# Patient Record
Sex: Female | Born: 1935 | Race: Black or African American | Hispanic: No | Marital: Single | Smoking: Never smoker
Health system: Southern US, Community
[De-identification: ages and names within clinical notes are randomized; demographics above are authoritative.]

## PROBLEM LIST (undated history)

## (undated) DIAGNOSIS — C55 Malignant neoplasm of uterus, part unspecified: Secondary | ICD-10-CM

## (undated) DIAGNOSIS — I10 Essential (primary) hypertension: Secondary | ICD-10-CM

## (undated) DIAGNOSIS — E119 Type 2 diabetes mellitus without complications: Secondary | ICD-10-CM

## (undated) DIAGNOSIS — E785 Hyperlipidemia, unspecified: Secondary | ICD-10-CM

## (undated) DIAGNOSIS — C569 Malignant neoplasm of unspecified ovary: Secondary | ICD-10-CM

## (undated) HISTORY — PX: TOTAL ABDOMINAL HYSTERECTOMY: SHX209

---

## 2007-04-17 ENCOUNTER — Ambulatory Visit: Payer: Self-pay | Admitting: Emergency Medicine

## 2007-05-18 ENCOUNTER — Ambulatory Visit: Payer: Self-pay | Admitting: Internal Medicine

## 2007-07-10 ENCOUNTER — Ambulatory Visit: Payer: Self-pay | Admitting: Obstetrics and Gynecology

## 2008-04-29 ENCOUNTER — Ambulatory Visit: Payer: Self-pay

## 2008-05-03 ENCOUNTER — Ambulatory Visit: Payer: Self-pay | Admitting: Orthopedic Surgery

## 2008-06-08 ENCOUNTER — Encounter: Payer: Self-pay | Admitting: Orthopedic Surgery

## 2008-06-30 ENCOUNTER — Encounter: Payer: Self-pay | Admitting: Orthopedic Surgery

## 2008-09-27 ENCOUNTER — Ambulatory Visit: Payer: Self-pay | Admitting: Unknown Physician Specialty

## 2009-02-07 ENCOUNTER — Ambulatory Visit: Payer: Self-pay | Admitting: Internal Medicine

## 2009-02-20 ENCOUNTER — Ambulatory Visit: Payer: Self-pay | Admitting: Unknown Physician Specialty

## 2010-02-22 ENCOUNTER — Ambulatory Visit: Payer: Self-pay | Admitting: Internal Medicine

## 2011-02-26 ENCOUNTER — Ambulatory Visit: Payer: Self-pay | Admitting: Internal Medicine

## 2012-04-14 ENCOUNTER — Ambulatory Visit: Payer: Self-pay | Admitting: Internal Medicine

## 2012-07-07 ENCOUNTER — Ambulatory Visit: Payer: Self-pay | Admitting: Cardiology

## 2013-05-18 ENCOUNTER — Ambulatory Visit: Payer: Self-pay | Admitting: Internal Medicine

## 2014-05-20 ENCOUNTER — Ambulatory Visit: Payer: Self-pay | Admitting: Internal Medicine

## 2015-03-16 ENCOUNTER — Other Ambulatory Visit: Payer: Self-pay | Admitting: Internal Medicine

## 2015-03-16 DIAGNOSIS — R1031 Right lower quadrant pain: Secondary | ICD-10-CM

## 2015-03-22 ENCOUNTER — Ambulatory Visit: Admission: RE | Admit: 2015-03-22 | Payer: Medicare Other | Source: Ambulatory Visit

## 2015-04-05 ENCOUNTER — Ambulatory Visit: Admission: RE | Admit: 2015-04-05 | Payer: Medicare Other | Source: Ambulatory Visit

## 2015-05-03 ENCOUNTER — Other Ambulatory Visit: Payer: Self-pay | Admitting: Cardiology

## 2015-05-03 DIAGNOSIS — R1031 Right lower quadrant pain: Secondary | ICD-10-CM

## 2015-05-10 ENCOUNTER — Ambulatory Visit
Admission: RE | Admit: 2015-05-10 | Discharge: 2015-05-10 | Disposition: A | Discharge status: Home / Self Care | Payer: Medicare Other | Source: Ambulatory Visit | Attending: Internal Medicine | Admitting: Internal Medicine

## 2015-05-10 DIAGNOSIS — R911 Solitary pulmonary nodule: Secondary | ICD-10-CM | POA: Diagnosis not present

## 2015-05-10 DIAGNOSIS — N281 Cyst of kidney, acquired: Secondary | ICD-10-CM | POA: Insufficient documentation

## 2015-05-10 DIAGNOSIS — N200 Calculus of kidney: Secondary | ICD-10-CM | POA: Diagnosis not present

## 2015-05-10 DIAGNOSIS — R1031 Right lower quadrant pain: Secondary | ICD-10-CM

## 2015-05-10 HISTORY — DX: Essential (primary) hypertension: I10

## 2015-05-10 MED ORDER — IOHEXOL 300 MG/ML  SOLN
100.0000 mL | Freq: Once | INTRAMUSCULAR | Status: AC | PRN
  Administered 2015-05-10: 100 mL via INTRAVENOUS

## 2015-05-17 ENCOUNTER — Other Ambulatory Visit: Payer: Self-pay | Admitting: Internal Medicine

## 2015-05-17 DIAGNOSIS — R932 Abnormal findings on diagnostic imaging of liver and biliary tract: Secondary | ICD-10-CM

## 2015-05-18 ENCOUNTER — Encounter: Payer: Self-pay | Admitting: Emergency Medicine

## 2015-05-18 ENCOUNTER — Observation Stay
Admission: EM | Admit: 2015-05-18 | Discharge: 2015-05-22 | Disposition: A | Discharge status: Home / Self Care | Payer: Medicare Other | Attending: Internal Medicine | Admitting: Internal Medicine

## 2015-05-18 ENCOUNTER — Emergency Department: Payer: Medicare Other

## 2015-05-18 DIAGNOSIS — Z1611 Resistance to penicillins: Secondary | ICD-10-CM | POA: Insufficient documentation

## 2015-05-18 DIAGNOSIS — R0602 Shortness of breath: Secondary | ICD-10-CM | POA: Insufficient documentation

## 2015-05-18 DIAGNOSIS — N390 Urinary tract infection, site not specified: Secondary | ICD-10-CM | POA: Diagnosis present

## 2015-05-18 DIAGNOSIS — R627 Adult failure to thrive: Secondary | ICD-10-CM | POA: Insufficient documentation

## 2015-05-18 DIAGNOSIS — Z596 Low income: Secondary | ICD-10-CM | POA: Diagnosis not present

## 2015-05-18 DIAGNOSIS — R05 Cough: Secondary | ICD-10-CM | POA: Diagnosis not present

## 2015-05-18 DIAGNOSIS — Z833 Family history of diabetes mellitus: Secondary | ICD-10-CM | POA: Diagnosis not present

## 2015-05-18 DIAGNOSIS — R509 Fever, unspecified: Secondary | ICD-10-CM | POA: Diagnosis not present

## 2015-05-18 DIAGNOSIS — R4182 Altered mental status, unspecified: Secondary | ICD-10-CM | POA: Diagnosis present

## 2015-05-18 DIAGNOSIS — Z8673 Personal history of transient ischemic attack (TIA), and cerebral infarction without residual deficits: Secondary | ICD-10-CM | POA: Insufficient documentation

## 2015-05-18 DIAGNOSIS — E785 Hyperlipidemia, unspecified: Secondary | ICD-10-CM | POA: Insufficient documentation

## 2015-05-18 DIAGNOSIS — I10 Essential (primary) hypertension: Secondary | ICD-10-CM | POA: Diagnosis present

## 2015-05-18 DIAGNOSIS — I2510 Atherosclerotic heart disease of native coronary artery without angina pectoris: Secondary | ICD-10-CM | POA: Diagnosis not present

## 2015-05-18 DIAGNOSIS — Z7982 Long term (current) use of aspirin: Secondary | ICD-10-CM | POA: Diagnosis not present

## 2015-05-18 DIAGNOSIS — Z90710 Acquired absence of both cervix and uterus: Secondary | ICD-10-CM | POA: Insufficient documentation

## 2015-05-18 DIAGNOSIS — R079 Chest pain, unspecified: Principal | ICD-10-CM | POA: Insufficient documentation

## 2015-05-18 DIAGNOSIS — B961 Klebsiella pneumoniae [K. pneumoniae] as the cause of diseases classified elsewhere: Secondary | ICD-10-CM | POA: Insufficient documentation

## 2015-05-18 DIAGNOSIS — R112 Nausea with vomiting, unspecified: Secondary | ICD-10-CM | POA: Diagnosis not present

## 2015-05-18 LAB — COMPREHENSIVE METABOLIC PANEL
ALK PHOS: 83 U/L (ref 38–126)
ALT: 14 U/L (ref 14–54)
AST: 20 U/L (ref 15–41)
Albumin: 4.2 g/dL (ref 3.5–5.0)
Anion gap: 8 (ref 5–15)
BUN: 16 mg/dL (ref 6–20)
CALCIUM: 8.8 mg/dL — AB (ref 8.9–10.3)
CHLORIDE: 109 mmol/L (ref 101–111)
CO2: 25 mmol/L (ref 22–32)
CREATININE: 1.1 mg/dL — AB (ref 0.44–1.00)
GFR calc non Af Amer: 46 mL/min — ABNORMAL LOW (ref >60)
GFR, EST AFRICAN AMERICAN: 53 mL/min — AB (ref >60)
Glucose, Bld: 107 mg/dL — ABNORMAL HIGH (ref 65–99)
Potassium: 3.7 mmol/L (ref 3.5–5.1)
SODIUM: 142 mmol/L (ref 135–145)
Total Bilirubin: 0.2 mg/dL — ABNORMAL LOW (ref 0.3–1.2)
Total Protein: 7.5 g/dL (ref 6.5–8.1)

## 2015-05-18 LAB — CBC
HEMATOCRIT: 39.1 % (ref 35.0–47.0)
HEMOGLOBIN: 12.6 g/dL (ref 12.0–16.0)
MCH: 29.2 pg (ref 26.0–34.0)
MCHC: 32.1 g/dL (ref 32.0–36.0)
MCV: 91 fL (ref 80.0–100.0)
Platelets: 155 10*3/uL (ref 150–440)
RBC: 4.3 MIL/uL (ref 3.80–5.20)
RDW: 14.4 % (ref 11.5–14.5)
WBC: 4.7 10*3/uL (ref 3.6–11.0)

## 2015-05-18 LAB — URINALYSIS COMPLETE WITH MICROSCOPIC (ARMC ONLY)
Bilirubin Urine: NEGATIVE
GLUCOSE, UA: NEGATIVE mg/dL
KETONES UR: NEGATIVE mg/dL
NITRITE: POSITIVE — AB
PROTEIN: NEGATIVE mg/dL
SPECIFIC GRAVITY, URINE: 1.011 (ref 1.005–1.030)
pH: 6 (ref 5.0–8.0)

## 2015-05-18 LAB — CBC WITH DIFFERENTIAL/PLATELET
BASOS PCT: 1 %
Basophils Absolute: 0 10*3/uL (ref 0–0.1)
EOS ABS: 0.1 10*3/uL (ref 0–0.7)
EOS PCT: 3 %
HCT: 38.9 % (ref 35.0–47.0)
HEMOGLOBIN: 12.6 g/dL (ref 12.0–16.0)
Lymphocytes Relative: 23 %
Lymphs Abs: 1.1 10*3/uL (ref 1.0–3.6)
MCH: 29.5 pg (ref 26.0–34.0)
MCHC: 32.4 g/dL (ref 32.0–36.0)
MCV: 90.9 fL (ref 80.0–100.0)
MONO ABS: 0.8 10*3/uL (ref 0.2–0.9)
MONOS PCT: 17 %
NEUTROS PCT: 56 %
Neutro Abs: 2.7 10*3/uL (ref 1.4–6.5)
PLATELETS: 157 10*3/uL (ref 150–440)
RBC: 4.28 MIL/uL (ref 3.80–5.20)
RDW: 14.5 % (ref 11.5–14.5)
WBC: 4.8 10*3/uL (ref 3.6–11.0)

## 2015-05-18 LAB — CK: Total CK: 118 U/L (ref 38–234)

## 2015-05-18 LAB — LIPASE, BLOOD: Lipase: 22 U/L (ref 22–51)

## 2015-05-18 LAB — TROPONIN I: Troponin I: <0.03 ng/mL (ref <0.031)

## 2015-05-18 MED ORDER — DEXTROSE 5 % IV SOLN
1.0000 g | Freq: Once | INTRAVENOUS | Status: AC
  Administered 2015-05-19: 1 g via INTRAVENOUS

## 2015-05-18 MED ORDER — SODIUM CHLORIDE 0.9 % IV BOLUS (SEPSIS)
1000.0000 mL | Freq: Once | INTRAVENOUS | Status: AC
  Administered 2015-05-18: 1000 mL via INTRAVENOUS
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
## 2015-05-19 ENCOUNTER — Encounter: Payer: Self-pay | Admitting: *Deleted

## 2015-05-19 DIAGNOSIS — R079 Chest pain, unspecified: Secondary | ICD-10-CM | POA: Diagnosis not present

## 2015-05-19 DIAGNOSIS — Z596 Low income: Secondary | ICD-10-CM

## 2015-05-19 DIAGNOSIS — I10 Essential (primary) hypertension: Secondary | ICD-10-CM | POA: Diagnosis present

## 2015-05-19 LAB — FOLATE: FOLATE: 11.1 ng/mL (ref >5.9)

## 2015-05-19 LAB — TSH: TSH: 2.044 u[IU]/mL (ref 0.350–4.500)

## 2015-05-19 LAB — VITAMIN B12: Vitamin B-12: 183 pg/mL (ref 180–914)

## 2015-05-19 MED ORDER — ATORVASTATIN CALCIUM 20 MG PO TABS
40.0000 mg | ORAL_TABLET | Freq: Every day | ORAL | Status: DC
Start: 2015-05-19 — End: 2015-05-22
  Administered 2015-05-19 – 2015-05-21 (×4): 40 mg via ORAL
  Filled 2015-05-19 (×4): qty 2

## 2015-05-19 MED ORDER — RAMIPRIL 5 MG PO CAPS
5.0000 mg | ORAL_CAPSULE | Freq: Every day | ORAL | Status: DC
  Administered 2015-05-19 – 2015-05-20 (×2): 5 mg via ORAL
  Filled 2015-05-19 (×3): qty 1

## 2015-05-19 MED ORDER — PANTOPRAZOLE SODIUM 40 MG PO TBEC
40.0000 mg | DELAYED_RELEASE_TABLET | Freq: Every day | ORAL | Status: DC
  Administered 2015-05-19 – 2015-05-22 (×4): 40 mg via ORAL
  Filled 2015-05-19 (×4): qty 1

## 2015-05-19 MED ORDER — ASPIRIN-DIPYRIDAMOLE ER 25-200 MG PO CP12
1.0000 | ORAL_CAPSULE | Freq: Two times a day (BID) | ORAL | Status: DC
  Administered 2015-05-19 – 2015-05-22 (×8): 1 via ORAL
  Filled 2015-05-19 (×8): qty 1

## 2015-05-19 MED ORDER — SODIUM CHLORIDE 0.9 % IJ SOLN
3.0000 mL | Freq: Two times a day (BID) | INTRAMUSCULAR | Status: DC
  Administered 2015-05-19 – 2015-05-22 (×8): 3 mL via INTRAVENOUS

## 2015-05-19 MED ORDER — ALPRAZOLAM 0.25 MG PO TABS: 0.2500 mg | ORAL_TABLET | Freq: Two times a day (BID) | ORAL | Status: DC | PRN

## 2015-05-19 MED ORDER — SODIUM CHLORIDE 0.9 % IJ SOLN: 3.0000 mL | INTRAMUSCULAR | Status: DC | PRN

## 2015-05-19 MED ORDER — ALUM & MAG HYDROXIDE-SIMETH 200-200-20 MG/5ML PO SUSP
15.0000 mL | Freq: Four times a day (QID) | ORAL | Status: DC | PRN
  Administered 2015-05-19 (×2): 15 mL via ORAL
  Filled 2015-05-19 (×2): qty 30

## 2015-05-19 MED ORDER — ONDANSETRON HCL 4 MG/2ML IJ SOLN
4.0000 mg | Freq: Four times a day (QID) | INTRAMUSCULAR | Status: DC | PRN
  Administered 2015-05-20 – 2015-05-21 (×2): 4 mg via INTRAVENOUS
  Filled 2015-05-19 (×2): qty 2

## 2015-05-19 MED ORDER — ENOXAPARIN SODIUM 40 MG/0.4ML ~~LOC~~ SOLN
40.0000 mg | Freq: Every day | SUBCUTANEOUS | Status: DC
  Administered 2015-05-19 – 2015-05-21 (×4): 40 mg via SUBCUTANEOUS
  Filled 2015-05-19 (×4): qty 0.4

## 2015-05-19 MED ORDER — SODIUM CHLORIDE 0.9 % IV SOLN: 250.0000 mL | INTRAVENOUS | Status: DC | PRN

## 2015-05-19 MED ORDER — AMLODIPINE BESYLATE 10 MG PO TABS
10.0000 mg | ORAL_TABLET | Freq: Every day | ORAL | Status: DC
  Administered 2015-05-19 – 2015-05-20 (×2): 10 mg via ORAL
  Filled 2015-05-19 (×3): qty 1

## 2015-05-19 MED ORDER — CITALOPRAM HYDROBROMIDE 20 MG PO TABS
20.0000 mg | ORAL_TABLET | Freq: Every day | ORAL | Status: DC
  Administered 2015-05-19 – 2015-05-22 (×4): 20 mg via ORAL
  Filled 2015-05-19 (×4): qty 1

## 2015-05-19 MED ORDER — ONDANSETRON HCL 4 MG PO TABS: 4.0000 mg | ORAL_TABLET | Freq: Four times a day (QID) | ORAL | Status: DC | PRN

## 2015-05-19 MED ORDER — ACETAMINOPHEN 325 MG PO TABS
650.0000 mg | ORAL_TABLET | Freq: Four times a day (QID) | ORAL | Status: DC | PRN
  Administered 2015-05-20 – 2015-05-21 (×2): 650 mg via ORAL
  Filled 2015-05-19 (×2): qty 2

## 2015-05-19 MED ORDER — DEXTROSE 5 % IV SOLN
1.0000 g | INTRAVENOUS | Status: DC
  Administered 2015-05-19: 1 g via INTRAVENOUS
  Filled 2015-05-19 (×2): qty 10

## 2015-05-19 NOTE — Care Management (Signed)
Admitted to [facility] with the diagnosis of urinary tract infection. Lives with son, [name] [phone]). Last seen Dr. Lisette Grinder Friday of last week. No home health. No skilled facility. No home oxygen. Uses no aids for ambulation. Good appetite. Fell last Thursday. States she does fall frequently. Physical therapy evaluation pending.  Received telephone call from Otilio Connors, Education officer, museum, for [place]. Requested psychiatry evaluation. Dr. Fritzi Mandes updated. Shelbie Ammons RN MSN Care Management [phone]
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
## 2015-05-19 NOTE — Progress Notes (Signed)
Notified Dr Posey Pronto that pt requested med for indigestion. MD verbalized to order Maalox 8ml qx6hrs prn.
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
## 2015-05-19 NOTE — Progress Notes (Addendum)
[place] at [place] NAME: Nyilah Kight    MR#:  [id number]  DATE OF BIRTH:  [birth year]  SUBJECTIVE:  Brought in to ER after found by DSS to be living in poor conditino at home. Son is at home (not the best historian either) Denies any complaints  REVIEW OF SYSTEMS:   Review of Systems  Constitutional: Negative for fever, chills and weight loss.  HENT: Negative for ear discharge, ear pain and nosebleeds.   Eyes: Negative for blurred vision, pain and discharge.  Respiratory: Negative for sputum production, shortness of breath, wheezing and stridor.   Cardiovascular: Negative for chest pain, palpitations, orthopnea and PND.  Gastrointestinal: Negative for nausea, vomiting, abdominal pain and diarrhea.  Genitourinary: Negative for urgency and frequency.  Musculoskeletal: Negative for back pain and joint pain.  Neurological: Negative for sensory change, speech change, focal weakness and weakness.  Psychiatric/Behavioral: Negative for depression and hallucinations. The patient is not nervous/anxious.   All other systems reviewed and are negative.  Tolerating Diet:yes Tolerating PT: pending  DRUG ALLERGIES:  No Known Allergies  VITALS:  Blood pressure 157/52, pulse 71, temperature 99 F (37.2 C), temperature source Oral, resp. rate 18, height 5\' 7"  (1.702 m), weight 77.747 kg (171 lb 6.4 oz), SpO2 99 %.  PHYSICAL EXAMINATION:   Physical Exam  GENERAL:  81 y.o.-year-old patient lying in the bed with no acute distress.  EYES: Pupils equal, round, reactive to light and accommodation. No scleral icterus. Extraocular muscles intact.  HEENT: Head atraumatic, normocephalic. Oropharynx and nasopharynx clear.  NECK:  Supple, no jugular venous distention. No thyroid enlargement, no tenderness.  LUNGS: Normal breath sounds bilaterally, no wheezing, rales, rhonchi. No use of accessory muscles of respiration.  CARDIOVASCULAR: S1, S2  normal. No murmurs, rubs, or gallops.  ABDOMEN: Soft, nontender, nondistended. Bowel sounds present. No organomegaly or mass.  EXTREMITIES: No cyanosis, clubbing or edema b/l.    NEUROLOGIC: Cranial nerves II through XII are intact. No focal Motor or sensory deficits b/l.   PSYCHIATRIC: The patient is alert and oriented x 3.  SKIN: No obvious rash, lesion, or ulcer.    LABORATORY PANEL:   CBC  Recent Labs Lab 05/18/15 1947  WBC 4.8  4.7  HGB 12.6  12.6  HCT 38.9  39.1  PLT 157  155    Chemistries   Recent Labs Lab 05/18/15 1947  NA 142  K 3.7  CL 109  CO2 25  GLUCOSE 107*  BUN 16  CREATININE 1.10*  CALCIUM 8.8*  AST 20  ALT 14  ALKPHOS 83  BILITOT 0.2*    Cardiac Enzymes  Recent Labs Lab 05/18/15 1947  TROPONINI <0.03    RADIOLOGY:  Dg Chest 2 View  05/18/2015   CLINICAL DATA:  Chest pain  EXAM: CHEST  2 VIEW  COMPARISON:  None.  FINDINGS: Interstitial coarsening which appears bronchitic. There is no edema, consolidation, effusion, or pneumothorax. Normal heart size and mediastinal contours. No acute osseous finding.  IMPRESSION: Bronchitic markings.  No consolidation or edema.   Electronically Signed   By: Monte Fantasia M.D.   On: 05/18/2015 21:29     ASSESSMENT AND PLAN:  81 y.o. female with a known history of hypertension, hyperlipidemia, CVA, coronary artery disease brought in by EMS since patient was found by social service with poor living conditions and no lower at home. Patient was evaluated by the ED physician and was noted to have UTI. There was a  mention of possible dementia by the ED physician  1. UTI. -pt asymptomatic at present -IV rocephine. Pending UC -wbc normal, pt afebrile 2. Hypertension, stable on home medications. 3. History of coronary artery disease, stable. No symptoms, EKG negative for acute ischemic changes, troponin 1 negative.  4. Hyperlipidemia, stable on statin 5. Poor living condition, found by DSS with no poser  at home. Lives with son -mention of possible dementia -will have psych eval done. -DSS involved -CM for d/c planning  Case discussed with Care Management/Social Worker. Management plans discussed with the patient, family and they are in agreement.  CODE STATUS: full  DVT Prophylaxis: lovenox  TOTAL TIME TAKING CARE OF THIS PATIENT: 30 minutes.  >50% time spent on counselling and coordination of care with pt,son and CM   Aviela Blundell M.D on 05/19/2015 at 11:31 AM  Between 7am to 6pm - Pager - [phone]  After 6pm go to [email or web address] - password [email or web address] [facility]  Office  [phone]  CC: Primary care physician; Madelyn Brunner, MD

## 2015-05-19 NOTE — Evaluation (Signed)
Physical Therapy Evaluation Patient Details Name: Allison Macias MRN: [id number] DOB: 27-Jan-1932 Today's Date: 05/19/2015   History of Present Illness  presented to ER secondary to reported chest pain (x2 weeks), found by DSS in poor living conditions and with history of frequent falls; admitted with UTI.  Clinical Impression  Upon evaluation, patient alert and oriented to basic information; follows all commands.  Demonstrates strength and ROM grossly WFL and symmetrical; no focal weakness appreciated.  Able to complete bed mobility mod indep; sit/stand, basic transfers and gait (50') without assist device, cga/min assist.  Improves to cga/close sup with use of RW; though gait speed remains significantly below age-matched norms (10' walk time, 12 seconds), indicative of increased fall risk.  Recommend continued use of RW with all mobility at this time.  Patient voices understanding/agreement and reports having RW at home already. Would benefit from skilled PT to address above deficits and promote optimal return to PLOF; Recommend transition to [facility] upon discharge from acute hospitalization.     Follow Up Recommendations Home health PT    Equipment Recommendations       Recommendations for Other Services       Precautions / Restrictions Precautions Precautions: Fall Restrictions Weight Bearing Restrictions: No      Mobility  Bed Mobility Overal bed mobility: Modified Independent                Transfers Overall transfer level: Needs assistance Equipment used: Rolling walker (2 wheeled) Transfers: Sit to/from Stand Sit to Stand: Min guard;Min assist            Ambulation/Gait Ambulation/Gait assistance: Min guard;Min assist Ambulation Distance (Feet): 50 Feet Assistive device: None     Gait velocity interpretation: <1.8 ft/sec, indicative of risk for recurrent falls General Gait Details: decreased step height/length, limited trunk rotation and arm swing; very  guarded.  limited ability to respond to external perturbation.  Stairs            Wheelchair Mobility    Modified Rankin (Stroke Patients Only)       Balance Overall balance assessment: Needs assistance Sitting-balance support: No upper extremity supported;Feet supported Sitting balance-Leahy Scale: Good     Standing balance support: No upper extremity supported Standing balance-Leahy Scale: Fair Standing balance comment: standing functional reach approx 3-4" from immediate BOS                             Pertinent Vitals/Pain Pain Assessment: No/denies pain    Home Living Family/patient expects to be discharged to:: Private residence Living Arrangements: Children (son, able to provide 24 hour sup as needed per patient) Available Help at Discharge: Family Type of Home: House Home Access: Stairs to enter Entrance Stairs-Rails: Right Entrance Stairs-Number of Steps: 3 Home Layout: Two level;Able to live on main level with bedroom/bathroom        Prior Function Level of Independence: Independent         Comments: Indep for all household activities/ADLs without assist device; does endorse history of frequent falls (unassociated with any specific activity, risk factors)     Hand Dominance        Extremity/Trunk Assessment   Upper Extremity Assessment: Overall WFL for tasks assessed           Lower Extremity Assessment: Overall WFL for tasks assessed         Communication   Communication: No difficulties  Cognition Arousal/Alertness: Awake/alert Behavior During Therapy:  WFL for tasks assessed/performed Overall Cognitive Status: Within Functional Limits for tasks assessed                      General Comments      Exercises Other Exercises Other Exercises: Gait x150' with RW, cga/close sup--improved step height/length, improved cadence and gait speed (though still significantly below age-matched norms; 10' walk time, 12  seconds).  Recommend continued use of RW for all mobiltiy at this time; patient voiced understanding/agreement (reports having one at home already). (8 min)      Assessment/Plan    PT Assessment    PT Diagnosis Difficulty walking;Generalized weakness   PT Problem List    PT Treatment Interventions     PT Goals (Current goals can be found in the Care Plan section) Acute Rehab PT Goals Patient Stated Goal: "to go back to bed" PT Goal Formulation: With patient Time For Goal Achievement: 06/02/15 Potential to Achieve Goals: Good    Frequency     Barriers to discharge        Co-evaluation               End of Session Equipment Utilized During Treatment: Gait belt Activity Tolerance: Patient tolerated treatment well Patient left: in bed;with call bell/phone within reach;with bed alarm set      Functional Assessment Tool Used: clinical judgement, 10' walk time/gait speed Functional Limitation: Mobility: Walking and moving around Mobility: Walking and Moving Around Current Status (Z6109): At least 20 percent but less than 40 percent impaired, limited or restricted Mobility: Walking and Moving Around Goal Status [phone]): At least 1 percent but less than 20 percent impaired, limited or restricted    Time: 1410-1424 PT Time Calculation (min) (ACUTE ONLY): 14 min   Charges:   PT Evaluation $Initial PT Evaluation Tier I: 1 Procedure PT Treatments $Gait Training: 8-22 mins   PT G Codes:   PT G-Codes **NOT FOR INPATIENT CLASS** Functional Assessment Tool Used: clinical judgement, 10' walk time/gait speed Functional Limitation: Mobility: Walking and moving around Mobility: Walking and Moving Around Current Status (U9811): At least 20 percent but less than 40 percent impaired, limited or restricted Mobility: Walking and Moving Around Goal Status [phone]): At least 1 percent but less than 20 percent impaired, limited or restricted    Jarah Pember H. Owens Shark, PT, DPT,  NCS 05/19/2015, 3:49 PM [phone]

## 2015-05-19 NOTE — Progress Notes (Signed)
ANTIBIOTIC CONSULT NOTE - INITIAL  Pharmacy Consult for ceftriaxone Indication: UTI  No Known Allergies  Patient Measurements: Height: 5\' 7"  (170.2 cm) Weight: 172 lb 14.4 oz (78.427 kg) IBW/kg (Calculated) : 61.6 Adjusted Body Weight: 78.4  Vital Signs: Temp: 98 F (36.7 C) (08/19 0049) Temp Source: Oral (08/19 0049) BP: 162/58 mmHg (08/19 0049) Pulse Rate: 65 (08/19 0049) Intake/Output from previous day:   Intake/Output from this shift:    Labs:  Recent Labs  05/18/15 1947  WBC 4.8  4.7  HGB 12.6  12.6  PLT 157  155  CREATININE 1.10*   Estimated Creatinine Clearance: 43.2 mL/min (by C-G formula based on Cr of 1.1). No results for input(s): VANCOTROUGH, VANCOPEAK, VANCORANDOM, GENTTROUGH, GENTPEAK, GENTRANDOM, TOBRATROUGH, TOBRAPEAK, TOBRARND, AMIKACINPEAK, AMIKACINTROU, AMIKACIN in the last 72 hours.   Microbiology: No results found for this or any previous visit (from the past 720 hour(s)).  Medical History: Past Medical History  Diagnosis Date  . Cancer   . Hypertension     Medications:  Infusions:   Assessment: 83 yof cc UTI/found by social services with poor living conditions and no power at home.   Goal of Therapy:  Normal temperature/labs/vitals, resolution of infection  Plan:  Expected duration 5 days with resolution of temperature and/or normalization of WBC. Started ceftriaxone 1 gm IV Q24H - will follow for culture and sensitivity.   Laural Benes, Pharm.D. Clinical Pharmacy 05/19/2015,12:56 AM

## 2015-05-19 NOTE — Consult Note (Signed)
[facility] Face-to-Face Psychiatry Consult   Reason for Consult:  Consult for this 81 year old woman currently in the hospital evidently with a urinary tract infection and elevated blood pressure. Referring Physician:  Posey Pronto Patient Identification: SYONA WROBLEWSKI MRN:  [id number] Principal Diagnosis: Poverty Diagnosis:   Patient Active Problem List   Diagnosis Date Noted  . HTN (hypertension) [I10] 05/19/2015  . Poverty [Z59.6] 05/19/2015  . UTI (urinary tract infection) [N39.0] 05/18/2015  . HTN (hypertension), benign [I10] 05/18/2015  . CAD (coronary artery disease) [I25.10] 05/18/2015    Total Time spent with patient: 45 minutes  Subjective:   NATALIAH HATLESTAD is a 81 y.o. female patient admitted with "I'm just feeling so cold" patient had no psychiatric complaint.Marland Kitchen  HPI:  Patient interviewed chart reviewed. Labs reviewed. 81 year old woman brought into the hospital after a social worker found her to apparently be in some degree of distress with difficult living circumstances. The patient tells me a Education officer, museum found her at home where there are utilities had recently been turned off. She has no lites and no water and no gas at home. This is because she and her son have been short of money and had not been keeping up with her bills. Patient has no mental health complaints. Denies being depressed. Denies suicidal thoughts. Denies feeling like she's having any impairment of her memory. No evidence of psychotic symptoms. Denies any abuse of substances.  Past psychiatric history: Patient denies any past psychiatric history or treatment  Social history: She lives with her adult son. Her son appears to be impaired in some way from what I could tell. He seemed to have some cognitive impairment. She has no other living children. Says that she has some other family members but they don't come around as much as they used to. Patient complains that while she and her son are supposed to both be getting  checks that she feels like she is frequently cheated out of her money. She indicates feeling like her bank is not treating her well. Frequently run short of money.  Medical history: High blood pressure current urinary tract infection. Possible coronary artery disease.  Substance abuse history: No alcohol or drug abuse currently or in the past.  Current medications: She does not know all the medicine she takes at home. Dr. Lisette Grinder is her primary care doctor. HPI Elements:   Quality:  Social difficulties with inability to pay all of her bills. Severity:  Resulting in severe social problems. Timing:  Chronic. Duration:  Ongoing possible worsening. Context:  Lack of support.  Past Medical History:  Past Medical History  Diagnosis Date  . Cancer   . Hypertension     Past Surgical History  Procedure Laterality Date  . Total abdominal hysterectomy     Family History:  Family History  Problem Relation Age of Onset  . Diabetes Sister    Social History:  History  Alcohol Use No     History  Drug Use No    Social History   Social History  . Marital Status: Single    Spouse Name: N/A  . Number of Children: N/A  . Years of Education: N/A   Social History Main Topics  . Smoking status: Never Smoker   . Smokeless tobacco: None  . Alcohol Use: No  . Drug Use: No  . Sexual Activity: Not Asked   Other Topics Concern  . None   Social History Narrative   Additional Social History:  Allergies:  No Known Allergies  Labs:  Results for orders placed or performed during the hospital encounter of 05/18/15 (from the past 48 hour(s))  CBC     Status: None   Collection Time: 05/18/15  7:47 PM  Result Value Ref Range   WBC 4.7 3.6 - 11.0 K/uL   RBC 4.30 3.80 - 5.20 MIL/uL   Hemoglobin 12.6 12.0 - 16.0 g/dL   HCT 39.1 35.0 - 47.0 %   MCV 91.0 80.0 - 100.0 fL   MCH 29.2 26.0 - 34.0 pg   MCHC 32.1 32.0 - 36.0 g/dL   RDW 14.4 11.5 - 14.5 %    Platelets 155 150 - 440 K/uL  Troponin I     Status: None   Collection Time: 05/18/15  7:47 PM  Result Value Ref Range   Troponin I <0.03 <0.031 ng/mL    Comment:        NO INDICATION OF MYOCARDIAL INJURY.   Comprehensive metabolic panel     Status: Abnormal   Collection Time: 05/18/15  7:47 PM  Result Value Ref Range   Sodium 142 135 - 145 mmol/L   Potassium 3.7 3.5 - 5.1 mmol/L   Chloride 109 101 - 111 mmol/L   CO2 25 22 - 32 mmol/L   Glucose, Bld 107 (H) 65 - 99 mg/dL   BUN 16 6 - 20 mg/dL   Creatinine, Ser 1.10 (H) 0.44 - 1.00 mg/dL   Calcium 8.8 (L) 8.9 - 10.3 mg/dL   Total Protein 7.5 6.5 - 8.1 g/dL   Albumin 4.2 3.5 - 5.0 g/dL   AST 20 15 - 41 U/L   ALT 14 14 - 54 U/L   Alkaline Phosphatase 83 38 - 126 U/L   Total Bilirubin 0.2 (L) 0.3 - 1.2 mg/dL   GFR calc non Af Amer 46 (L) >60 mL/min   GFR calc Af Amer 53 (L) >60 mL/min    Comment: (NOTE) The eGFR has been calculated using the CKD EPI equation. This calculation has not been validated in all clinical situations. eGFR's persistently <60 mL/min signify possible Chronic Kidney Disease.    Anion gap 8 5 - 15  CBC with Differential     Status: None   Collection Time: 05/18/15  7:47 PM  Result Value Ref Range   WBC 4.8 3.6 - 11.0 K/uL   RBC 4.28 3.80 - 5.20 MIL/uL   Hemoglobin 12.6 12.0 - 16.0 g/dL   HCT 38.9 35.0 - 47.0 %   MCV 90.9 80.0 - 100.0 fL   MCH 29.5 26.0 - 34.0 pg   MCHC 32.4 32.0 - 36.0 g/dL   RDW 14.5 11.5 - 14.5 %   Platelets 157 150 - 440 K/uL   Neutrophils Relative % 56 %   Neutro Abs 2.7 1.4 - 6.5 K/uL   Lymphocytes Relative 23 %   Lymphs Abs 1.1 1.0 - 3.6 K/uL   Monocytes Relative 17 %   Monocytes Absolute 0.8 0.2 - 0.9 K/uL   Eosinophils Relative 3 %   Eosinophils Absolute 0.1 0 - 0.7 K/uL   Basophils Relative 1 %   Basophils Absolute 0.0 0 - 0.1 K/uL  CK     Status: None   Collection Time: 05/18/15  7:47 PM  Result Value Ref Range   Total CK 118 38 - 234 U/L  Lipase, blood      Status: None   Collection Time: 05/18/15  7:47 PM  Result Value Ref Range   Lipase  22 22 - 51 U/L  Urinalysis complete, with microscopic     Status: Abnormal   Collection Time: 05/18/15  9:54 PM  Result Value Ref Range   Color, Urine YELLOW (A) YELLOW   APPearance HAZY (A) CLEAR   Glucose, UA NEGATIVE NEGATIVE mg/dL   Bilirubin Urine NEGATIVE NEGATIVE   Ketones, ur NEGATIVE NEGATIVE mg/dL   Specific Gravity, Urine 1.011 1.005 - 1.030   Hgb urine dipstick 1+ (A) NEGATIVE   pH 6.0 5.0 - 8.0   Protein, ur NEGATIVE NEGATIVE mg/dL   Nitrite POSITIVE (A) NEGATIVE   Leukocytes, UA 3+ (A) NEGATIVE   RBC / HPF 0-5 0 - 5 RBC/hpf   WBC, UA TOO NUMEROUS TO COUNT 0 - 5 WBC/hpf   Bacteria, UA MANY (A) NONE SEEN   Squamous Epithelial / LPF 0-5 (A) NONE SEEN   Mucous PRESENT   Vitamin B12     Status: None   Collection Time: 05/18/15 10:29 PM  Result Value Ref Range   Vitamin B-12 183 180 - 914 pg/mL    Comment: (NOTE) This assay is not validated for testing neonatal or myeloproliferative syndrome specimens for Vitamin B12 levels. Performed at [facility]   Folate     Status: None   Collection Time: 05/18/15 10:29 PM  Result Value Ref Range   Folate 11.1 >5.9 ng/mL  TSH     Status: None   Collection Time: 05/18/15 10:29 PM  Result Value Ref Range   TSH 2.044 0.350 - 4.500 uIU/mL    Vitals: Blood pressure 152/57, pulse 66, temperature 98.2 F (36.8 C), temperature source Oral, resp. rate 18, height _0  (1.702 m), weight 77.747 kg (171 lb 6.4 oz), SpO2 95 %.  Risk to Self: Is patient at risk for suicide?: No Risk to Others:   Prior Inpatient Therapy:   Prior Outpatient Therapy:    Current Facility-Administered Medications  Medication Dose Route Frequency Provider Last Rate Last Dose  . 0.9 %  sodium chloride infusion  250 mL Intravenous PRN Juluis Mire, MD      . acetaminophen (TYLENOL) tablet 650 mg  650 mg Oral Q6H PRN Juluis Mire, MD      . ALPRAZolam Duanne Moron)  tablet 0.25 mg  0.25 mg Oral BID PRN Juluis Mire, MD      . alum & mag hydroxide-simeth (MAALOX/MYLANTA) 200-200-20 MG/5ML suspension 15 mL  15 mL Oral Q6H PRN Fritzi Mandes, MD   15 mL at 05/19/15 1055  . amLODipine (NORVASC) tablet 10 mg  10 mg Oral Daily Juluis Mire, MD   10 mg at 05/19/15 1053  . atorvastatin (LIPITOR) tablet 40 mg  40 mg Oral QHS Juluis Mire, MD   40 mg at 05/19/15 0158  . cefTRIAXone (ROCEPHIN) 1 g in dextrose 5 % 50 mL IVPB  1 g Intravenous Q24H Juluis Mire, MD      . citalopram (CELEXA) tablet 20 mg  20 mg Oral Daily Juluis Mire, MD   20 mg at 05/19/15 1053  . dipyridamole-aspirin (AGGRENOX) 200-25 MG per 12 hr capsule 1 capsule  1 capsule Oral BID Juluis Mire, MD   1 capsule at 05/19/15 1053  . enoxaparin (LOVENOX) injection 40 mg  40 mg Subcutaneous Q2000 Juluis Mire, MD   40 mg at 05/19/15 0158  . ondansetron (ZOFRAN) tablet 4 mg  4 mg Oral Q6H PRN Juluis Mire, MD       Or  .  ondansetron (ZOFRAN) injection 4 mg  4 mg Intravenous Q6H PRN Juluis Mire, MD      . pantoprazole (PROTONIX) EC tablet 40 mg  40 mg Oral QAC breakfast Juluis Mire, MD   40 mg at 05/19/15 4270  . ramipril (ALTACE) capsule 5 mg  5 mg Oral Daily Juluis Mire, MD   5 mg at 05/19/15 1059  . sodium chloride 0.9 % injection 3 mL  3 mL Intravenous Q12H Juluis Mire, MD   3 mL at 05/19/15 1055  . sodium chloride 0.9 % injection 3 mL  3 mL Intravenous PRN Juluis Mire, MD        Musculoskeletal: Strength & Muscle Tone: within normal limits Gait & Station: unable to stand Patient leans: N/A  Psychiatric Specialty Exam: Physical Exam  Constitutional: She appears well-developed and well-nourished. She has a sickly appearance.  HENT:  Head: Normocephalic and atraumatic.  Eyes: Conjunctivae are normal. Pupils are equal, round, and reactive to light.  Neck: Normal range of motion.  Cardiovascular: Normal heart sounds.   Respiratory: Effort  normal.  GI: Soft.  Musculoskeletal: Normal range of motion.  Neurological: She is alert.  Skin: Skin is warm and dry.  Psychiatric: She has a normal mood and affect. Her speech is normal and behavior is normal. Judgment and thought content normal. Cognition and memory are normal.    Review of Systems  Constitutional: Positive for chills, weight loss and malaise/fatigue.  HENT: Negative.   Eyes: Negative.   Respiratory: Positive for cough.   Cardiovascular: Negative.   Gastrointestinal: Positive for nausea.  Musculoskeletal: Negative.   Skin: Negative.   Neurological: Negative.   Psychiatric/Behavioral: Negative for depression, suicidal ideas, hallucinations, memory loss and substance abuse. The patient is not nervous/anxious and does not have insomnia.     Blood pressure 152/57, pulse 66, temperature 98.2 F (36.8 C), temperature source Oral, resp. rate 18, height _0  (1.702 m), weight 77.747 kg (171 lb 6.4 oz), SpO2 95 %.Body mass index is 26.84 kg/(m^2).  General Appearance: Fairly Groomed  Engineer, water::  Good  Speech:  Clear and Coherent  Volume:  Normal  Mood:  Anxious  Affect:  Congruent  Thought Process:  Goal Directed  Orientation:  Full (Time, Place, and Person)  Thought Content:  Negative  Suicidal Thoughts:  No  Homicidal Thoughts:  No  Memory:  Immediate;   Good Recent;   Good Remote;   Good  Judgement:  Intact  Insight:  Present  Psychomotor Activity:  Decreased  Concentration:  Good  Recall:  Good  Fund of Knowledge:Good  Language: Good  Akathisia:  No  Handed:  Right  AIMS (if indicated):     Assets:  Communication Skills Desire for Improvement Resilience  ADL's:  Intact  Cognition: WNL  Sleep:      Medical Decision Making: Review of Psycho-Social Stressors (1) and Established Problem, Worsening (2)  Treatment Plan Summary: Plan 81 year old woman in the hospital in part because of severe impoverishment at home. The consult mention the  possibility of dementia. To my exam today the patient is clear and coherent with intact short-term and long-term memory and appears to be clear in her thinking. I don't see any indication of significant dementia. She denies any depressive symptoms. There is no psychosis or substance abuse. I do not find an actual psychiatric diagnosis or problem with this patient. She appears to be struggling to take care of herself and her clearly impaired son as  she is increasingly elderly and frail with minimal social support. Problems appear to be social rather than psychiatric. No indication for any psychiatric intervention or treatment. I will try and checked by to see if I missed anything or can be of any further help. No medication at this time.  Plan:  Supportive therapy provided about ongoing stressors. Disposition: No psychiatric intervention I will check up over the next day or 2.  John Clapacs 05/19/2015 7:35 PM

## 2015-05-19 NOTE — Plan of Care (Addendum)
Problem: Discharge Progression Outcomes Goal: Other Discharge Outcomes/Goals VSS. Denies pain. Vomited once in am, verbalized relief, denied nausea med. C/o indigestion/hurtburn, maalox ordered and given with improvement. Tolerates diet. Pt ambulates around the nursing station with a walker and [facility] psychiatry consult.

## 2015-05-20 DIAGNOSIS — R079 Chest pain, unspecified: Secondary | ICD-10-CM | POA: Diagnosis not present

## 2015-05-20 MED ORDER — CEPHALEXIN 500 MG PO CAPS
500.0000 mg | ORAL_CAPSULE | Freq: Two times a day (BID) | ORAL | Status: DC
  Administered 2015-05-20 – 2015-05-22 (×5): 500 mg via ORAL
  Filled 2015-05-20 (×5): qty 1

## 2015-05-20 NOTE — Clinical Social Work Note (Signed)
Unable to reach customer service at [facility] to confirm if power was restored to patient's home. The phone at [facility] stated that someone could be reached between Monday and Friday. CSW has contacted [facility] office to have the DSS on call worker paged to let them know that the physician is ready to discharge patient but does not want to discharge unless she is aware if patient's power has been restored. Shela Leff MSW,LCSW [phone]

## 2015-05-20 NOTE — Progress Notes (Signed)
[place] [facility] [place] NAME: Allison Macias    MR#:  [id number]  DATE OF BIRTH:  01/28/1932  SUBJECTIVE:  Brought in to ER after found by DSS to be living in poor conditino at home. Son is at home (not the best historian either) Denies any complaints at present  REVIEW OF SYSTEMS:   Review of Systems  Constitutional: Negative for fever, chills and weight loss.  HENT: Negative for ear discharge, ear pain and nosebleeds.   Eyes: Negative for blurred vision, pain and discharge.  Respiratory: Negative for sputum production, shortness of breath, wheezing and stridor.   Cardiovascular: Negative for chest pain, palpitations, orthopnea and PND.  Gastrointestinal: Negative for nausea, vomiting, abdominal pain and diarrhea.  Genitourinary: Negative for urgency and frequency.  Musculoskeletal: Negative for back pain and joint pain.  Neurological: Negative for sensory change, speech change, focal weakness and weakness.  Psychiatric/Behavioral: Negative for depression and hallucinations. The patient is not nervous/anxious.   All other systems reviewed and are negative.  Tolerating Diet:yes Tolerating PT: HHPT  DRUG ALLERGIES:  No Known Allergies  VITALS:  Blood pressure 130/62, pulse 92, temperature 99 F (37.2 C), temperature source Oral, resp. rate 20, height 5\' 7"  (1.702 m), weight 75.887 kg (167 lb 4.8 oz), SpO2 97 %.  PHYSICAL EXAMINATION:   Physical Exam  GENERAL:  81 y.o.-year-old patient lying in the bed with no acute distress.  EYES: Pupils equal, round, reactive to light and accommodation. No scleral icterus. Extraocular muscles intact.  HEENT: Head atraumatic, normocephalic. Oropharynx and nasopharynx clear.  NECK:  Supple, no jugular venous distention. No thyroid enlargement, no tenderness.  LUNGS: Normal breath sounds bilaterally, no wheezing, rales, rhonchi. No use of accessory muscles of respiration.  CARDIOVASCULAR: S1,  S2 normal. No murmurs, rubs, or gallops.  ABDOMEN: Soft, nontender, nondistended. Bowel sounds present. No organomegaly or mass.  EXTREMITIES: No cyanosis, clubbing or edema b/l.    NEUROLOGIC: Cranial nerves II through XII are intact. No focal Motor or sensory deficits b/l.   PSYCHIATRIC: The patient is alert and oriented x 3.  SKIN: No obvious rash, lesion, or ulcer.    LABORATORY PANEL:   CBC  Recent Labs Lab 05/18/15 1947  WBC 4.8  4.7  HGB 12.6  12.6  HCT 38.9  39.1  PLT 157  155    Chemistries   Recent Labs Lab 05/18/15 1947  NA 142  K 3.7  CL 109  CO2 25  GLUCOSE 107*  BUN 16  CREATININE 1.10*  CALCIUM 8.8*  AST 20  ALT 14  ALKPHOS 83  BILITOT 0.2*    Cardiac Enzymes  Recent Labs Lab 05/18/15 1947  TROPONINI <0.03    RADIOLOGY:  Dg Chest 2 View  05/18/2015   CLINICAL DATA:  Chest pain  EXAM: CHEST  2 VIEW  COMPARISON:  None.  FINDINGS: Interstitial coarsening which appears bronchitic. There is no edema, consolidation, effusion, or pneumothorax. Normal heart size and mediastinal contours. No acute osseous finding.  IMPRESSION: Bronchitic markings.  No consolidation or edema.   Electronically Signed   By: Monte Fantasia M.D.   On: 05/18/2015 21:29     ASSESSMENT AND PLAN:  81 y.o. female with a known history of hypertension, hyperlipidemia, CVA, coronary artery disease brought in by EMS since patient was found by social service with poor living conditions and no lower at home. Patient was evaluated by the ED physician and was noted to have UTI. There  was a mention of possible dementia by the ED physician  1. UTI. -pt asymptomatic at present -IV rocephine. Change to po abxs -wbc normal, pt afebrile 2. Hypertension, stable on home medications. 3. History of coronary artery disease, stable. No symptoms, EKG negative for acute ischemic changes, troponin 1 negative.  4. Hyperlipidemia, stable on statin 5. Poor living condition, found by DSS with  no poser at home. Lives with son -mention of possible dementia -Dr clapacs input noted-no recommendations per them -DSS involved -CM for d/c planning -son not sure whether electricity is restored or not the patient otherwise ready for d/c. CSW working on it  Case discussed with Care Management/Social Worker. Management plans discussed with the patient, family and they are in agreement.  CODE STATUS: full  DVT Prophylaxis: lovenox  TOTAL TIME TAKING CARE OF THIS PATIENT: 30 minutes.  >50% time spent on counselling and coordination of care with pt,son and CM   Jony Ladnier M.D on 05/20/2015 at 10:21 AM  Between 7am to 6pm - Pager - [phone]  After 6pm go to [email or web address] - password [email or web address] [facility]  Office  [phone]  CC: Primary care physician; Madelyn Brunner, MD

## 2015-05-20 NOTE — Plan of Care (Signed)
Problem: Discharge Progression Outcomes Goal: Other Discharge Outcomes/Goals Outcome: Progressing Patient is alert and oriented, c/o chest discomfort, describing it as "bubbly". Maalox given with improvement. Son remains at bedside. Resting quietly.

## 2015-05-20 NOTE — Plan of Care (Signed)
Problem: Discharge Progression Outcomes Goal: Other Discharge Outcomes/Goals Outcome: Progressing 1.  Case management and social work consults in place- evaluating discharge needs. Pt lives at home in very poor home situation with son with limited ability to care for self/ son limited in ability to care for pt. 2. Denies pain this shift. 3. VSS. Labs stable.  4. Zofran x 1 at change of shift for nausea with relief. 5. Tolerated diet well. Tray obtained for son who was eating pt's food 6. Started on po cephalexin. No urinary co's. Rested quietly in bed.

## 2015-05-20 NOTE — Progress Notes (Signed)
Son came to nurse's desk asking for paper plate in that his mother, the pt, and he were going to share her breakfast. Sandwich tray provided to son with juice and advised pt needs to eat her breakfast tray.

## 2015-05-20 NOTE — Clinical Social Work Note (Signed)
CSW spoke with on call DSS worker: Abran Duke this afternoon and he stated he spoke with DSS supervisor, Pershing Cox and DSS caseworker, Rory Percy about my message. Sparky stated that there was no way until Monday for them to confirm whether or not patient had her electricity back on and that they would like it if patient would be able to stay through the weekend until they can get out to the home to ensure it has necessities. Shela Leff MSW,LCSW [phone]

## 2015-05-20 NOTE — Clinical Social Work Note (Signed)
Clinical Social Work Assessment  Patient Details  Name: Allison Macias MRN: [id number] Date of Birth: 04-Jan-1932  Date of referral:  05/19/15               Reason for consult:  Abuse/Neglect, Financial Concerns                Permission sought to share information with:  Family Supports Permission granted to share information::  Yes, Verbal Permission Granted  Name::        Agency::     Relationship::  son  Contact Information:     Housing/Transportation Living arrangements for the past 2 months:  [place] of Information:  Patient, Case Manager Patient Interpreter Needed:  None Criminal Activity/Legal Involvement Pertinent to Current Situation/Hospitalization:  No - Comment as needed Significant Relationships:  Adult Children Lives with:  Adult Children Do you feel safe going back to the place where you live?    Need for family participation in patient care:  Yes (Comment)  Care giving concerns:  Pt lives with her adult son who she reports is around 50 years old. She states that DSS came to their house and she told them that she wasn't feeling well so they called EMS to bring her to ED. Pt reports that the water and power have been off for about 4 days due to not paying the bill. She states that they are supposed to be cut back on today or tomorrow (Saturday). DSS likely intervening to assist with water as that can be turned on same day emergently by the city.  Pt reports that adult son does not work and does not have transportation but "gets a ride" when he can. Pt has adult son's cell phone in her room which is functioning but Pt has limited knowledge on how to use it. Pt reports that "[facility]" sends a car to get her to her PCP office. Pt does have [facility] but CSW cannot verify if they arrange rides to appts for her. CSW is unsure who called APS, but they are actively involved in the case and supporting the family. RNs to do not report  any concerns with interactions between Pt and her son.   Social Worker assessment / plan:  CSW met with Pt at bedside. Pt reported that her son had to leave to meet the people who were turning on the utilities so he was not at bedside. Staff reports that he spent the night and stayed with Pt most of her hospitalization thus far. Pt was born and raised in [place], has had 4 children (1 in the home, 3 out of state), and is divorced. Pt is delirious at times, so some of her reporting is questionable. She states that she divorced because her husband "ripped a baby out of her belly" and that she ended up adopting 31 children from the community that were placed with her after being left by mothers at churches or on door steps, etc. CSW discussed needs at home, which patient is aware of the needs but is confident that they are being fixed today. CSW discussed placement if needed and Pt stated that she wants to return home. Pt did say that she is agreeable to home health coming into the home for PT and RN needs. CSW left message with Rory Percy, APS worker who initiated investigation but have not heard an update. Pt will likely return home with [facility] and APS following to assist  with stabilization in the home.   Employment status:  Retired Nurse, adult PT Recommendations:    Information / Referral to community resources:  APS (Comment Required: [place], Name & Number of worker spoken with) ([place])  Patient/Family's Response to care:  Pt's son has been supportive in the hospital according to staff. Pt's son not at bedside during [place] assessment due to coordinating reinstatement of utilities at home according to Pt. Pt appreciative of care at hospital and reports to CSW that she wants to go home. Pt appears anxious to be in hospital without her son nearby.   Patient/Family's Understanding of and Emotional Response to Diagnosis, Current Treatment, and Prognosis:  Pt  does not verbalize any concerns to CSW regarding her health. She states that she has abdominal pain to CSW during the beginning of the assessment but does not mention it once we start talking or indicates any physical discomfort.   Emotional Assessment Appearance:  Appears stated age Attitude/Demeanor/Rapport:  Apprehensive Affect (typically observed):  Anxious Orientation:  Oriented to Place, Oriented to Situation, Oriented to Self Alcohol / Substance use:  Never Used Psych involvement (Current and /or in the community):  Yes (Comment) (impatient consult pending)  Discharge Needs  Concerns to be addressed:  Home Safety Concerns, Discharge Planning Concerns Readmission within the last 30 days:  No Current discharge risk:  Lack of support system Barriers to Discharge:  Continued Medical Work up   [employer], LCSW 05/20/2015, 7:32 AM

## 2015-05-21 DIAGNOSIS — R079 Chest pain, unspecified: Secondary | ICD-10-CM | POA: Diagnosis not present

## 2015-05-21 NOTE — Progress Notes (Signed)
Pt talking on phone. Reported to nurse that she was talking to her boyfriend. "He lives in [place]." "No, I have never met him, but he loves me and we are going to get married." Other family member at bedside states he agrees with this.

## 2015-05-21 NOTE — Care Management Note (Signed)
Case Management Note  Patient Details  Name: [name] MRN: [id number] Date of Birth: 02-13-1932  Subjective/Objective:    81yo Mrs Allison Macias was admitted 05/18/15 to an Observation bed per c/o chest pain. Also has a UTI. [name] resides with her son [name] who was at her bedside during this interview. PCP=Dr Lisette Grinder. Pharmacy= [facility] in [place]. Home equipment includes a cane and a front wheel rolling walker. Per son [name], [name] uses Medicaid transportation. [facility] nurse visits once a month. No other home health services. Case management will follow for discharge planning. Anticipate need for assistance with transportation home after discharge.               Action/Plan:   Expected Discharge Date:                  Expected Discharge Plan:     In-House Referral:     Discharge planning Services     Post Acute Care Choice:    Choice offered to:     DME Arranged:    DME Agency:     HH Arranged:    [place] Agency:     Status of Service:     Medicare Important Message Given:    Date Medicare IM Given:    Medicare IM give by:    Date Additional Medicare IM Given:    Additional Medicare Important Message give by:     If discussed at [place] of Stay Meetings, dates discussed:    Additional Comments:  Nyheem Binette A, RN 05/21/2015, 3:40 PM

## 2015-05-21 NOTE — Plan of Care (Signed)
Problem: Discharge Progression Outcomes Goal: Other Discharge Outcomes/Goals Outcome: Progressing Patient is alert and oriented, c/o stomach pain. Tylenol given x1 with relief. Son remains at bedside, appears to be staying in the hospital room with patient and only getting food from hospital staff.  Patient states she is still talking to her [place] boyfriend who lives outside of [place]. She states she was going to get married but got sick so they had to postpone it. The son is in agreement to this, stating the [place] boyfriend is going to take both of them on a trip to [place] or [place]. Patient states she has never met this man, but has fallen in love over the phone. Patient is talking to him on the hospital telephone.

## 2015-05-21 NOTE — Plan of Care (Signed)
Problem: Discharge Progression Outcomes Goal: Other Discharge Outcomes/Goals Outcome: Progressing 1. MD plans to discharge pt home tomorrow if pt/family can confirm they have electricity with SW aware of situation with plans for DSS follow up. 2. Reported HA x 1-refused po pain meds. "Just want an "alcohol swab to rub my head; it will make it go away." Family member at bedside adamant that pt only needed alcohol swab. Recheck with pt denying HA. 3. VSS. Afebrile. BP parameters lower normal with MD notified with amlodipine and altace discontinued. No weak/near syncopy episodes when up to BR; generalized weakness/mild unsteadiness when up. Slept at frequent intervals with family member remaining at bedside.  4. Tolerated diet well; no nausea/vomiting. 5. Up to BR with 1+ with generalized weakness/unsteady gait. Consider benefit of rehab for pt for strengthening.

## 2015-05-21 NOTE — Progress Notes (Signed)
Allison Macias at [place] NAME: Allison Macias    MR#:  [id number]  DATE OF BIRTH:  [birth year]  SUBJECTIVE:  Brought in to ER after found by DSS to be living in poor conditino at home. Son is at home (not the best historian either) Denies any complaints at present.  REVIEW OF SYSTEMS:   Review of Systems  Constitutional: Negative for fever, chills and weight loss.  HENT: Negative for ear discharge, ear pain and nosebleeds.   Eyes: Negative for blurred vision, pain and discharge.  Respiratory: Negative for sputum production, shortness of breath, wheezing and stridor.   Cardiovascular: Negative for chest pain, palpitations, orthopnea and PND.  Gastrointestinal: Negative for nausea, vomiting, abdominal pain and diarrhea.  Genitourinary: Negative for urgency and frequency.  Musculoskeletal: Negative for back pain and joint pain.  Neurological: Negative for sensory change, speech change, focal weakness and weakness.  Psychiatric/Behavioral: Negative for depression and hallucinations. The patient is not nervous/anxious.   All other systems reviewed and are negative.  Tolerating Diet:yes Tolerating PT: HHPT  DRUG ALLERGIES:  No Known Allergies  VITALS:  Blood pressure 107/60, pulse 67, temperature 98.6 F (37 C), temperature source Oral, resp. rate 18, height 5\' 7"  (1.702 m), weight 77.293 kg (170 lb 6.4 oz), SpO2 95 %.  PHYSICAL EXAMINATION:   Physical Exam  GENERAL:  81 y.o.-year-old patient lying in the bed with no acute distress.  EYES: Pupils equal, round, reactive to light and accommodation. No scleral icterus. Extraocular muscles intact.  HEENT: Head atraumatic, normocephalic. Oropharynx and nasopharynx clear.  NECK:  Supple, no jugular venous distention. No thyroid enlargement, no tenderness.  LUNGS: Normal breath sounds bilaterally, no wheezing, rales, rhonchi. No use of accessory muscles of respiration.  CARDIOVASCULAR: S1,  S2 normal. No murmurs, rubs, or gallops.  ABDOMEN: Soft, nontender, nondistended. Bowel sounds present. No organomegaly or mass.  EXTREMITIES: No cyanosis, clubbing or edema b/l.    NEUROLOGIC: Cranial nerves II through XII are intact. No focal Motor or sensory deficits b/l.   PSYCHIATRIC: The patient is alert and oriented x 3.  SKIN: No obvious rash, lesion, or ulcer.    LABORATORY PANEL:   CBC  Recent Labs Lab 05/18/15 1947  WBC 4.8  4.7  HGB 12.6  12.6  HCT 38.9  39.1  PLT 157  155    Chemistries   Recent Labs Lab 05/18/15 1947  NA 142  K 3.7  CL 109  CO2 25  GLUCOSE 107*  BUN 16  CREATININE 1.10*  CALCIUM 8.8*  AST 20  ALT 14  ALKPHOS 83  BILITOT 0.2*    Cardiac Enzymes  Recent Labs Lab 05/18/15 1947  TROPONINI <0.03    RADIOLOGY:  No results found.   ASSESSMENT AND PLAN:  81 y.o. female with a known history of hypertension, hyperlipidemia, CVA, coronary artery disease brought in by EMS since patient was found by social service with poor living conditions and no lower at home. Patient was evaluated by the ED physician and was noted to have UTI. There was a mention of possible dementia by the ED physician  1. UTI. -pt asymptomatic at present -IV rocephine. Change to po abxs -wbc normal, pt afebrile 2. Hypertension, stable on home medications. 3. History of coronary artery disease, stable. No symptoms, EKG negative for acute ischemic changes, troponin 1 negative.  4. Hyperlipidemia, stable on statin 5. Poor living condition, found by DSS with no poser at home. Lives with son -  mention of possible dementia -Dr clapacs input noted-no recommendations per them -DSS involved -CM for d/c planning -son not sure whether electricity is restored or not the patient otherwise ready for d/c. CSW working on it  Case discussed with Care Management/Social Worker. Management plans discussed with the patient, family and they are in agreement.  CODE  STATUS: full  DVT Prophylaxis: lovenox  TOTAL TIME TAKING CARE OF THIS PATIENT: 30 minutes.  >50% time spent on counselling and coordination of care with pt,son and CM   Aleksandr Pellow M.D on 05/21/2015 at 10:52 AM  Between 7am to 6pm - Pager - [phone]  After 6pm go to [email or web address] - password [email or web address] [facility]  Office  [phone]  CC: Primary care physician; Madelyn Brunner, MD

## 2015-05-21 NOTE — Progress Notes (Signed)
TC to Dr. Posey Pronto with BP reading, current BP meds and weak/near syncopal episode last night with diaphoresis. Order received not to given amlodipine and altace anymore.

## 2015-05-22 DIAGNOSIS — R079 Chest pain, unspecified: Secondary | ICD-10-CM | POA: Diagnosis not present

## 2015-05-22 LAB — URINE CULTURE

## 2015-05-22 LAB — CREATININE, SERUM
Creatinine, Ser: 1.25 mg/dL — ABNORMAL HIGH (ref 0.44–1.00)
GFR, EST AFRICAN AMERICAN: 45 mL/min — AB (ref >60)
GFR, EST NON AFRICAN AMERICAN: 39 mL/min — AB (ref >60)

## 2015-05-22 MED ORDER — SENNA 8.6 MG PO TABS
1.0000 | ORAL_TABLET | Freq: Every day | ORAL | Status: DC | PRN
  Administered 2015-05-22: 8.6 mg via ORAL
  Filled 2015-05-22: qty 1

## 2015-05-22 MED ORDER — DOCUSATE SODIUM 100 MG PO CAPS
100.0000 mg | ORAL_CAPSULE | Freq: Every day | ORAL | Status: DC
  Administered 2015-05-22: 100 mg via ORAL
  Filled 2015-05-22: qty 1

## 2015-05-22 MED ORDER — CEPHALEXIN 500 MG PO CAPS: 500.0000 mg | ORAL_CAPSULE | Freq: Two times a day (BID) | ORAL | Status: DC

## 2015-05-22 NOTE — Plan of Care (Signed)
Problem: Discharge Progression Outcomes Goal: Other Discharge Outcomes/Goals Outcome: Progressing Plan of care progress to goals: Discharge plan-possible d/c to home tomorrow, care management is to follow up to make sure patient has power at home prior to discharge.  Pain control-pt denies pain this shift. Hemodynamically stable-VSS Tolerating diet- no c/o nausea or vomiting. Activity- Up to BR with 1+ with generalized weakness/unsteady gait

## 2015-05-22 NOTE — Discharge Summary (Signed)
[facility] [place] NAME: Allison Macias    MR#:  [id number]  DATE OF BIRTH:  03/27/1932  DATE OF ADMISSION:  05/18/2015 ADMITTING PHYSICIAN: Juluis Mire, MD  DATE OF DISCHARGE: 05/22/15  PRIMARY CARE PHYSICIAN: Madelyn Brunner, MD    ADMISSION DIAGNOSIS:  Acute UTI [N39.0] Chest pain, unspecified chest pain type [R07.9] Altered mental status, unspecified altered mental status type [R41.82]  DISCHARGE DIAGNOSIS:  Failure to thrive-poor living condition-resolving UTI HTN  SECONDARY DIAGNOSIS:   Past Medical History  Diagnosis Date  . Cancer   . Hypertension     HOSPITAL COURSE:   81 y.o. female with a known history of hypertension, hyperlipidemia, CVA, coronary artery disease brought in by EMS since patient was found by social service with poor living conditions and no lower at home. Patient was evaluated by the ED physician and was noted to have UTI. There was a mention of possible dementia by the ED physician  1. UTI. -pt asymptomatic at present -IV rocephine. Change to po abxs -wbc normal, pt afebrile 2. Hypertension, stable on home medications. 3. History of coronary artery disease, stable. No symptoms, EKG negative for acute ischemic changes, troponin 1 negative.  4. Hyperlipidemia, stable on statin 5. Poor living condition, found by DSS with no poser at home. Lives with son -Dr clapacs input noted-no recommendations per them, no dementia per psych -DSS involved -son told me electricity is restored  D/c home  DISCHARGE CONDITIONS:   fair CONSULTS OBTAINED:  Treatment Team:  Gonzella Lex, MD  DRUG ALLERGIES:  No Known Allergies  DISCHARGE MEDICATIONS:   Current Discharge Medication List    START taking these medications   Details  cephALEXin (KEFLEX) 500 MG capsule Take 1 capsule (500 mg total) by mouth 2 (two) times daily. Qty: 6 capsule, Refills: 0      CONTINUE these medications  which have NOT CHANGED   Details  acetaminophen (TYLENOL) 325 MG tablet Take 650 mg by mouth every 6 (six) hours as needed for mild pain or headache.    ALPRAZolam (XANAX) 0.25 MG tablet Take 0.25 mg by mouth 2 (two) times daily as needed for sleep.    amLODipine (NORVASC) 10 MG tablet Take 10 mg by mouth daily.    atorvastatin (LIPITOR) 40 MG tablet Take 40 mg by mouth daily.    citalopram (CELEXA) 20 MG tablet Take 20 mg by mouth daily.    dipyridamole-aspirin (AGGRENOX) 200-25 MG per 12 hr capsule Take 1 capsule by mouth 2 (two) times daily.    naproxen (NAPROSYN) 375 MG tablet Take 375 mg by mouth 2 (two) times daily with a meal.    omeprazole (PRILOSEC) 20 MG capsule Take 20 mg by mouth daily.    ramipril (ALTACE) 5 MG capsule Take 5 mg by mouth daily.        If you experience worsening of your admission symptoms, develop shortness of breath, life threatening emergency, suicidal or homicidal thoughts you must seek medical attention immediately by calling 911 or calling your MD immediately  if symptoms less severe.  You Must read complete instructions/literature along with all the possible adverse reactions/side effects for all the Medicines you take and that have been prescribed to you. Take any new Medicines after you have completely understood and accept all the possible adverse reactions/side effects.   Please note  You were cared for by a hospitalist during your hospital stay. If you have any  questions about your discharge medications or the care you received while you were in the hospital after you are discharged, you can call the unit and asked to speak with the hospitalist on call if the hospitalist that took care of you is not available. Once you are discharged, your primary care physician will handle any further medical issues. Please note that NO REFILLS for any discharge medications will be authorized once you are discharged, as it is imperative that you return to your  primary care physician (or establish a relationship with a primary care physician if you do not have one) for your aftercare needs so that they can reassess your need for medications and monitor your lab values. Today   SUBJECTIVE   Doing well  VITAL SIGNS:  Blood pressure 137/54, pulse 80, temperature 98.4 F (36.9 C), temperature source Oral, resp. rate 17, height 5\' 7"  (1.702 m), weight 77.429 kg (170 lb 11.2 oz), SpO2 98 %.  I/O:   Intake/Output Summary (Last 24 hours) at 05/22/15 0931 Last data filed at 05/21/15 1800  Gross per 24 hour  Intake    480 ml  Output    300 ml  Net    180 ml    PHYSICAL EXAMINATION:  GENERAL:  81 y.o.-year-old patient lying in the bed with no acute distress.  EYES: Pupils equal, round, reactive to light and accommodation. No scleral icterus. Extraocular muscles intact.  HEENT: Head atraumatic, normocephalic. Oropharynx and nasopharynx clear.  NECK:  Supple, no jugular venous distention. No thyroid enlargement, no tenderness.  LUNGS: Normal breath sounds bilaterally, no wheezing, rales,rhonchi or crepitation. No use of accessory muscles of respiration.  CARDIOVASCULAR: S1, S2 normal. No murmurs, rubs, or gallops.  ABDOMEN: Soft, non-tender, non-distended. Bowel sounds present. No organomegaly or mass.  EXTREMITIES: No pedal edema, cyanosis, or clubbing.  NEUROLOGIC: Cranial nerves II through XII are intact. Muscle strength 5/5 in all extremities. Sensation intact. Gait not checked.  PSYCHIATRIC: The patient is alert and oriented x 3.  SKIN: No obvious rash, lesion, or ulcer.   DATA REVIEW:   CBC   Recent Labs Lab 05/18/15 1947  WBC 4.8  4.7  HGB 12.6  12.6  HCT 38.9  39.1  PLT 157  155    Chemistries   Recent Labs Lab 05/18/15 1947 05/22/15 0506  NA 142  --   K 3.7  --   CL 109  --   CO2 25  --   GLUCOSE 107*  --   BUN 16  --   CREATININE 1.10* 1.25*  CALCIUM 8.8*  --   AST 20  --   ALT 14  --   ALKPHOS 83  --    BILITOT 0.2*  --     Microbiology Results   Recent Results (from the past 240 hour(s))  Urine culture     Status: None (Preliminary result)   Collection Time: 05/18/15  9:54 PM  Result Value Ref Range Status   Specimen Description URINE, RANDOM  Final   Special Requests NONE  Final   Culture   Final    >=100,000 COLONIES/mL GRAM NEGATIVE RODS IDENTIFICATION AND SUSCEPTIBILITIES TO FOLLOW    Report Status PENDING  Incomplete    RADIOLOGY:  No results found.   Management plans discussed with the patient, family and they are in agreement.  CODE STATUS:     Code Status Orders        Start     Ordered   05/19/15 [id number]  Full code   Continuous     05/19/15 0048      TOTAL TIME TAKING CARE OF THIS PATIENT: 40 minutes.    Rinda Rollyson M.D on 05/22/2015 at 9:31 AM  Between 7am to 6pm - Pager - [phone] After 6pm go to [email or web address] - password [email or web address] [facility]  Office  [phone]  CC: Primary care physician; Madelyn Brunner, MD

## 2015-05-22 NOTE — Progress Notes (Signed)
Dr. Reece Levy notified of 16 beat run of SVT, highest HR 103, sustained at 83. Patient asymptomatic. No new orders at this time, continue to monitor.

## 2015-05-22 NOTE — Discharge Instructions (Signed)
HHPT °

## 2015-05-22 NOTE — Plan of Care (Signed)
Pt d/ced home.  Was reported by night nurse that pt's home now has power.  Reviewed d/c instructions and f/u appt w/Dr. Gilford Rile.  Pt also to have U/S abd on Friday.  Reviewed that she's not to eat or drink after MN.  Removed IV.  Pt will be on PO abx.  Pt given taxi voucher.  Called [facility].  Going home w/son.

## 2015-05-22 NOTE — Progress Notes (Signed)
Physical Therapy Treatment Patient Details Name: Allison Macias MRN: [id number] DOB: Aug 19, 1932 Today's Date: 05/22/2015    History of Present Illness presented to ER secondary to reported chest pain (x2 weeks), found by DSS in poor living conditions and with history of frequent falls; admitted with UTI.    PT Comments    Improved to close sup for all mobility (with use of RW).   Patient with slower self-selected gait speed (10' walk time, 12 seconds), but no significant balance losses or safety concerns.  Reinforced recommendations for RW with all mobility; patient/son voiced understanding and agreement, report having RW at home already. Did issued handout with written/pictorial descriptions of standing LE therex for use outside of therapy (with RW for external support).  Patient voiced understanding. Offered stair assessment/training this date; patient declined.   Follow Up Recommendations  Home health PT     Equipment Recommendations   (patient/son report having RW at home already)    Recommendations for Other Services       Precautions / Restrictions Precautions Precautions: Fall Restrictions Weight Bearing Restrictions: No    Mobility  Bed Mobility Overal bed mobility: Independent                Transfers Overall transfer level: Needs assistance Equipment used: Rolling walker (2 wheeled) Transfers: Sit to/from Stand Sit to Stand: Supervision         General transfer comment: minimal use of bilat UEs noted with all movement transitions  Ambulation/Gait Ambulation/Gait assistance: Supervision Ambulation Distance (Feet): 220 Feet Assistive device: Rolling walker (2 wheeled)     Gait velocity interpretation: <1.8 ft/sec, indicative of risk for recurrent falls (10' walk time, 12 seconds at self-selected gait speed) General Gait Details: fair step height/length, fair cadence/gait speed (though remains below age-matched norms).  Able to complete head  turns, start/stop and direction changes without LOB.     Stairs            Wheelchair Mobility    Modified Rankin (Stroke Patients Only)       Balance                                    Cognition                            Exercises Other Exercises Other Exercises: Bilat LE standing therex, 1x15, AROM with RW, cga/close sup: heel raises, mini squats, marching, hip flex/ext/abduct/adduct.  No buckling/LOb.  Did issued handout with written/pictorial descriptions of therex; encouraged performance as HEP outside of therapy.  Patient voiced understanding. Other Exercises: Toilet transfer, ambulatory with RW, close sup.  Sit/stand from standard toilet, close sup; standing balance during clothing management, hand hygiene at sink, close sup.    General Comments        Pertinent Vitals/Pain Pain Assessment: No/denies pain    Home Living                      Prior Function            PT Goals (current goals can now be found in the care plan section) Acute Rehab PT Goals Patient Stated Goal: "to go back to bed" PT Goal Formulation: With patient Time For Goal Achievement: 06/02/15 Potential to Achieve Goals: Good Progress towards PT goals: Progressing toward goals    Frequency  Min 2X/week  PT Plan Current plan remains appropriate    Co-evaluation             End of Session Equipment Utilized During Treatment: Gait belt Activity Tolerance: Patient tolerated treatment well Patient left: in bed;with call bell/phone within reach;with bed alarm set     Time: 4720-7218 PT Time Calculation (min) (ACUTE ONLY): 23 min  Charges:  $Gait Training: 8-22 mins $Therapeutic Exercise: 8-22 mins                    G Codes:      Oluwadamilola Deliz H. Owens Shark, PT, DPT, NCS 05/22/2015, 10:14 AM [phone]

## 2015-05-22 NOTE — Care Management Note (Signed)
Case Management Note  Patient Details  Name: Allison Macias MRN: [id number] Date of Birth: [birth year]  Subjective/Objective:    Toma Copier CSW is providing a taxi voucher for discharge home. Call to [facility] per referral for home health PT. [name] reports that she has no phone and Colletta [place] will discuss communication options with her in her hospital room today prior to discharge.                Action/Plan:   Expected Discharge Date:                  Expected Discharge Plan:     In-House Referral:     Discharge planning Services     Post Acute Care Choice:    Choice offered to:     DME Arranged:    DME Agency:     HH Arranged:    [place] Agency:     Status of Service:     Medicare Important Message Given:    Date Medicare IM Given:    Medicare IM give by:    Date Additional Medicare IM Given:    Additional Medicare Important Message give by:     If discussed at [place] of Stay Meetings, dates discussed:    Additional Comments:  Taijon Vink A, RN 05/22/2015, 10:33 AM

## 2015-05-26 ENCOUNTER — Ambulatory Visit: Payer: Medicare Other

## 2015-07-06 ENCOUNTER — Emergency Department: Payer: Medicare Other

## 2015-07-06 ENCOUNTER — Emergency Department
Admission: EM | Admit: 2015-07-06 | Discharge: 2015-07-07 | Disposition: A | Discharge status: Home / Self Care | Payer: Medicare Other | Attending: Emergency Medicine | Admitting: Emergency Medicine

## 2015-07-06 ENCOUNTER — Encounter: Payer: Self-pay | Admitting: *Deleted

## 2015-07-06 DIAGNOSIS — I10 Essential (primary) hypertension: Secondary | ICD-10-CM | POA: Diagnosis not present

## 2015-07-06 DIAGNOSIS — Z791 Long term (current) use of non-steroidal anti-inflammatories (NSAID): Secondary | ICD-10-CM | POA: Diagnosis not present

## 2015-07-06 DIAGNOSIS — R410 Disorientation, unspecified: Secondary | ICD-10-CM | POA: Insufficient documentation

## 2015-07-06 DIAGNOSIS — N390 Urinary tract infection, site not specified: Secondary | ICD-10-CM | POA: Diagnosis not present

## 2015-07-06 DIAGNOSIS — Z596 Low income: Secondary | ICD-10-CM

## 2015-07-06 DIAGNOSIS — R319 Hematuria, unspecified: Secondary | ICD-10-CM

## 2015-07-06 DIAGNOSIS — Z79899 Other long term (current) drug therapy: Secondary | ICD-10-CM | POA: Diagnosis not present

## 2015-07-06 DIAGNOSIS — F0390 Unspecified dementia without behavioral disturbance: Secondary | ICD-10-CM

## 2015-07-06 DIAGNOSIS — F989 Unspecified behavioral and emotional disorders with onset usually occurring in childhood and adolescence: Secondary | ICD-10-CM | POA: Diagnosis present

## 2015-07-06 LAB — URINALYSIS COMPLETE WITH MICROSCOPIC (ARMC ONLY)
Bilirubin Urine: NEGATIVE
Glucose, UA: NEGATIVE mg/dL
Ketones, ur: NEGATIVE mg/dL
NITRITE: NEGATIVE
PH: 7 (ref 5.0–8.0)
PROTEIN: NEGATIVE mg/dL
SPECIFIC GRAVITY, URINE: 1.009 (ref 1.005–1.030)

## 2015-07-06 LAB — COMPREHENSIVE METABOLIC PANEL
ALBUMIN: 4.2 g/dL (ref 3.5–5.0)
ALT: 19 U/L (ref 14–54)
ANION GAP: 8 (ref 5–15)
AST: 20 U/L (ref 15–41)
Alkaline Phosphatase: 75 U/L (ref 38–126)
BILIRUBIN TOTAL: 0.2 mg/dL — AB (ref 0.3–1.2)
BUN: 21 mg/dL — AB (ref 6–20)
CHLORIDE: 107 mmol/L (ref 101–111)
CO2: 25 mmol/L (ref 22–32)
Calcium: 9.2 mg/dL (ref 8.9–10.3)
Creatinine, Ser: 1.17 mg/dL — ABNORMAL HIGH (ref 0.44–1.00)
GFR calc Af Amer: 49 mL/min — ABNORMAL LOW (ref >60)
GFR calc non Af Amer: 43 mL/min — ABNORMAL LOW (ref >60)
GLUCOSE: 126 mg/dL — AB (ref 65–99)
POTASSIUM: 3.4 mmol/L — AB (ref 3.5–5.1)
SODIUM: 140 mmol/L (ref 135–145)
TOTAL PROTEIN: 7.3 g/dL (ref 6.5–8.1)

## 2015-07-06 LAB — TSH: TSH: 2.413 u[IU]/mL (ref 0.350–4.500)

## 2015-07-06 LAB — URINE DRUG SCREEN, QUALITATIVE (ARMC ONLY)
Amphetamines, Ur Screen: NOT DETECTED
Barbiturates, Ur Screen: NOT DETECTED
Benzodiazepine, Ur Scrn: NOT DETECTED
CANNABINOID 50 NG, UR ~~LOC~~: NOT DETECTED
COCAINE METABOLITE, UR ~~LOC~~: NOT DETECTED
MDMA (ECSTASY) UR SCREEN: NOT DETECTED
METHADONE SCREEN, URINE: NOT DETECTED
OPIATE, UR SCREEN: NOT DETECTED
Phencyclidine (PCP) Ur S: NOT DETECTED
TRICYCLIC, UR SCREEN: NOT DETECTED

## 2015-07-06 LAB — CBC
HEMATOCRIT: 38.9 % (ref 35.0–47.0)
HEMOGLOBIN: 12.9 g/dL (ref 12.0–16.0)
MCH: 29.9 pg (ref 26.0–34.0)
MCHC: 33.1 g/dL (ref 32.0–36.0)
MCV: 90.4 fL (ref 80.0–100.0)
Platelets: 183 10*3/uL (ref 150–440)
RBC: 4.3 MIL/uL (ref 3.80–5.20)
RDW: 13.7 % (ref 11.5–14.5)
WBC: 4 10*3/uL (ref 3.6–11.0)

## 2015-07-06 LAB — ETHANOL: Alcohol, Ethyl (B): <5 mg/dL (ref <5)

## 2015-07-06 LAB — TROPONIN I: Troponin I: <0.03 ng/mL (ref <0.031)

## 2015-07-06 MED ORDER — CEPHALEXIN 500 MG PO CAPS
500.0000 mg | ORAL_CAPSULE | Freq: Three times a day (TID) | ORAL | Status: DC
  Administered 2015-07-07 (×3): 500 mg via ORAL
  Filled 2015-07-06 (×3): qty 1

## 2015-07-06 MED ORDER — IOHEXOL 240 MG/ML SOLN
25.0000 mL | Freq: Once | INTRAMUSCULAR | Status: AC | PRN
  Administered 2015-07-06: 25 mL via ORAL

## 2015-07-06 MED ORDER — IOHEXOL 300 MG/ML  SOLN
75.0000 mL | Freq: Once | INTRAMUSCULAR | Status: AC | PRN
  Administered 2015-07-06: 75 mL via INTRAVENOUS

## 2015-07-06 MED ORDER — TUBERCULIN PPD 5 UNIT/0.1ML ID SOLN
5.0000 [IU] | Freq: Once | INTRADERMAL | Status: DC
  Administered 2015-07-06: 5 [IU] via INTRADERMAL
  Filled 2015-07-06: qty 0.1

## 2015-07-06 MED ORDER — SODIUM CHLORIDE 0.9 % IV SOLN
Freq: Once | INTRAVENOUS | Status: AC
  Administered 2015-07-06: 250 mL via INTRAVENOUS

## 2015-07-06 NOTE — ED Notes (Signed)
Pt in room. No complaints or concerns voiced at this time. No abnormal behavior noted at this time. Will continue to monitor with q15 min checks. ODS officer in area. 

## 2015-07-06 NOTE — ED Notes (Signed)

## 2015-07-06 NOTE — ED Notes (Signed)

## 2015-07-06 NOTE — ED Notes (Signed)
Pt to moved to CT

## 2015-07-06 NOTE — ED Notes (Signed)
Pt states she is unsure why she is here, states she had abd pain and kidney problems and when the police came to her house today she came with them, pt states she has water and food at home and her son takes care of her, pt denies any SI or HI, pt states she hasnt seen her daughter in 66 years and the FBI finally found her and she is supposed to come visit her today

## 2015-07-06 NOTE — BH Assessment (Signed)
Assessment Note  Allison Macias is an 81 y.o. female presenting to the ED under IVC, initiated by DSS.  According to the IVC, pt has not been compliant with taking her medications and has no food and water in the home.  Pt lives with her son who is responsible for helping her take care of the home.  Pt's son has a diagnosis of Intellectual Developmentally Delayed.  Pt denies any SI/HI.  Diagnosis: Behavioral  Past Medical History:  Past Medical History  Diagnosis Date  . Cancer ([place])   . Hypertension     Past Surgical History  Procedure Laterality Date  . Total abdominal hysterectomy      Family History:  Family History  Problem Relation Age of Onset  . Diabetes Sister     Social History:  reports that she has never smoked. She does not have any smokeless tobacco history on file. She reports that she does not drink alcohol or use illicit drugs.  Additional Social History:  Alcohol / Drug Use History of alcohol / drug use?: No history of alcohol / drug abuse  CIWA: CIWA-Ar BP: (!) 153/80 mmHg Pulse Rate: 72 COWS:    Allergies: No Known Allergies  Home Medications:  (Not in a hospital admission)  OB/GYN Status:  No LMP recorded. Patient has had a hysterectomy.  General Assessment Data Location of Assessment: [facility] ED TTS Assessment: In system Is this a Tele or Face-to-Face Assessment?: Face-to-Face Is this an Initial Assessment or a Re-assessment for this encounter?: Initial Assessment Marital status: Single Maiden name: Dano Is patient pregnant?: No Pregnancy Status: No Living Arrangements: Children Can pt return to current living arrangement?: No Admission Status: Involuntary Is patient capable of signing voluntary admission?: No Referral Source: Other (DSS) Insurance type: Medicare  Medical Screening Exam ([place]) Medical Exam completed: Yes  Crisis Care Plan Living Arrangements: Children Name of Psychiatrist: N/A Name of Therapist:  N/A  Education Status Is patient currently in school?: No Current Grade: N/A Highest grade of school patient has completed: N/A Name of school: N/a Contact person: N/A  Risk to self with the past 6 months Suicidal Ideation: No Has patient been a risk to self within the past 6 months prior to admission? : No Suicidal Intent: No Has patient had any suicidal intent within the past 6 months prior to admission? : No Is patient at risk for suicide?: No Suicidal Plan?: No Has patient had any suicidal plan within the past 6 months prior to admission? : No Access to Means: No What has been your use of drugs/alcohol within the last 12 months?: N/A Previous Attempts/Gestures: No How many times?: 0 Other Self Harm Risks: N/A Triggers for Past Attempts: None known Intentional Self Injurious Behavior: None Family Suicide History: No Persecutory voices/beliefs?: No Depression: No Substance abuse history and/or treatment for substance abuse?: No Suicide prevention information given to non-admitted patients: Not applicable  Risk to Others within the past 6 months Homicidal Ideation: No Does patient have any lifetime risk of violence toward others beyond the six months prior to admission? : No Thoughts of Harm to Others: No Current Homicidal Intent: No Current Homicidal Plan: No Access to Homicidal Means: No Identified Victim: N/A History of harm to others?: No Assessment of Violence: None Noted Violent Behavior Description: N/A Does patient have access to weapons?: No Criminal Charges Pending?: No Does patient have a court date: No Is patient on probation?: No  Psychosis Hallucinations: None noted Delusions: None noted  Mental Status Report Appearance/Hygiene: In scrubs Eye Contact: Fair Motor Activity: Freedom of movement Speech: Soft Level of Consciousness: Drowsy Mood: Anxious Affect: Anxious Anxiety Level: Minimal Thought Processes: Circumstantial Judgement:  Partial Orientation: Person, Place, Time Obsessive Compulsive Thoughts/Behaviors: None  Cognitive Functioning Concentration: Good Memory: Recent Impaired IQ: Average Insight: Fair Impulse Control: Fair Appetite: Fair Weight Loss: 0 Weight Gain: 0 Sleep: No Change Total Hours of Sleep: 8 Vegetative Symptoms: None  ADLScreening [facility] Assessment Services) Patient's cognitive ability adequate to safely complete daily activities?: Yes Patient able to express need for assistance with ADLs?: Yes Independently performs ADLs?: Yes (appropriate for developmental age)  Prior Inpatient Therapy Prior Inpatient Therapy: No Prior Therapy Dates: N/A Prior Therapy Facilty/Provider(s): N/A Reason for Treatment: N/A  Prior Outpatient Therapy Prior Outpatient Therapy: No Prior Therapy Dates: N/A Prior Therapy Facilty/Provider(s): N/A Reason for Treatment: N/A Does patient have an ACCT team?: No Does patient have Intensive In-House Services?  : No Does patient have [facility] services? : No Does patient have P4CC services?: No  ADL Screening (condition at time of admission) Patient's cognitive ability adequate to safely complete daily activities?: Yes Patient able to express need for assistance with ADLs?: Yes Independently performs ADLs?: Yes (appropriate for developmental age)       Abuse/Neglect Assessment (Assessment to be complete while patient is alone) Physical Abuse: Denies Verbal Abuse: Denies Sexual Abuse: Denies Exploitation of patient/patient's resources: Denies Self-Neglect: Denies Values / Beliefs Cultural Requests During Hospitalization: None Spiritual Requests During Hospitalization: None Consults Spiritual Care Consult Needed: No Social Work Consult Needed: Yes (Comment) Advance Directives (For Healthcare) Does patient have an advance directive?: No    Additional Information 1:1 In Past 12 Months?: No CIRT Risk: No Elopement Risk: No     Disposition:   Disposition Initial Assessment Completed for this Encounter: Yes Disposition of Patient: Other dispositions Other disposition(s): Other (Comment) (Psych MD consult)  On Site Evaluation by:   Reviewed with Physician:    Daira Hine C Maryam Feely 07/06/2015 9:30 PM

## 2015-07-06 NOTE — ED Notes (Signed)
BEHAVIORAL HEALTH ROUNDING Patient sleeping: No. Patient alert and oriented: yes Behavior appropriate: Yes.   Nutrition and fluids offered: Yes  Toileting and hygiene offered: Yes  Sitter present: q15 min observations Law enforcement present: Yes [employer] 

## 2015-07-06 NOTE — ED Notes (Signed)
Pt returned from CT °

## 2015-07-06 NOTE — ED Notes (Signed)
Pt transported to CT with ED tech

## 2015-07-06 NOTE — ED Provider Notes (Signed)
[facility] Emergency Department Provider Note  ____________________________________________  Time seen: Approximately 6:18 PM  I have reviewed the triage vital signs and the nursing notes.   HISTORY  Chief Complaint Behavior Problem  History of physical Limited by behavior problem HPI Allison Macias is a 81 y.o. female patient says she is doesn't know why she is here she wants to go back home because the [employer] found her daughter that was missing for 15 years in [place] bring her by today. Patient was brought in under commitment apparently DSS went to her house and found there was no food and water in the house was condemned mobile so they took the papers out against both the patient and the patient's 44 year old son who live together. Patient is to be found incompetent and placed apparently. Patient herself says she is taking her medicines but does have abdominal pain which radiates from the right flank around to the right lower quadrant or belly and has had some blood in her urine. Patient cannot really tell me how the pain feels. Patient also was uncertain when it started.  Past Medical History  Diagnosis Date  . Cancer ([place])   . Hypertension     Patient Active Problem List   Diagnosis Date Noted  . HTN (hypertension) 05/19/2015  . Poverty 05/19/2015  . UTI (urinary tract infection) 05/18/2015  . HTN (hypertension), benign 05/18/2015  . CAD (coronary artery disease) 05/18/2015    Past Surgical History  Procedure Laterality Date  . Total abdominal hysterectomy      Current Outpatient Rx  Name  Route  Sig  Dispense  Refill  . acetaminophen (TYLENOL) 325 MG tablet   Oral   Take 650 mg by mouth every 6 (six) hours as needed for mild pain or headache.         . ALPRAZolam (XANAX) 0.25 MG tablet   Oral   Take 0.25 mg by mouth 2 (two) times daily as needed for sleep.         Marland Kitchen atorvastatin (LIPITOR) 40 MG tablet   Oral   Take 40 mg by mouth  daily.         . cephALEXin (KEFLEX) 500 MG capsule   Oral   Take 1 capsule (500 mg total) by mouth 2 (two) times daily.   6 capsule   0   . citalopram (CELEXA) 20 MG tablet   Oral   Take 20 mg by mouth daily.         Marland Kitchen dipyridamole-aspirin (AGGRENOX) 200-25 MG per 12 hr capsule   Oral   Take 1 capsule by mouth 2 (two) times daily.         . naproxen (NAPROSYN) 375 MG tablet   Oral   Take 375 mg by mouth 2 (two) times daily with a meal.         . omeprazole (PRILOSEC) 20 MG capsule   Oral   Take 20 mg by mouth daily.           Allergies Review of patient's allergies indicates no known allergies.  Family History  Problem Relation Age of Onset  . Diabetes Sister     Social History Social History  Substance Use Topics  . Smoking status: Never Smoker   . Smokeless tobacco: None  . Alcohol Use: No    Review of Systems Constitutional: No fever/chills Eyes: No visual changes. ENT: No sore throat. Cardiovascular: Denies chest pain. Respiratory: Denies shortness of breath. Gastrointestinal: No  abdominal pain.  No nausea, no vomiting.  No diarrhea.  No constipation. Genitourinary: Negative for dysuria. Musculoskeletal: Negative for back pain. Skin: Negative for rash. Neurological: Negative for headaches, focal weakness or numbness.  10-point ROS otherwise negative.  ____________________________________________   PHYSICAL EXAM:  VITAL SIGNS: ED Triage Vitals  Enc Vitals Group     BP 07/06/15 1741 145/56 mmHg     Pulse Rate 07/06/15 1741 63     Resp 07/06/15 1741 20     Temp 07/06/15 1741 98.3 F (36.8 C)     Temp Source 07/06/15 1741 Oral     SpO2 07/06/15 1741 98 %     Weight 07/06/15 1741 167 lb (75.751 kg)     Height 07/06/15 1741 5\' 7"  (1.702 m)     Head Cir --      Peak Flow --      Pain Score 07/06/15 1742 10     Pain Loc --      Pain Edu? --      Excl. in [place]? --     Constitutional: Alert  Well appearing and in no acute  distress. Eyes: Conjunctivae are normal. PERRL. EOMI. Head: Atraumatic. Nose: No congestion/rhinnorhea. Mouth/Throat: Mucous membranes are moist.  Oropharynx non-erythematous. Neck: No stridor.   Cardiovascular: Normal rate, regular rhythm. Grossly normal heart sounds.  Good peripheral circulation. Respiratory: Normal respiratory effort.  No retractions. Lungs CTAB. Gastrointestinal: Soft and nontender. No distention. No abdominal bruits. No CVA tenderness. Musculoskeletal: No lower extremity tenderness nor edema.  No joint effusions. Neurologic:  Normal speech and language. No gross focal neurologic deficits are appreciated. No gait instability. Skin:  Skin is warm, dry and intact. No rash noted. Psychiatric: Mood and affect are normal. Speech and behavior are normal.  ____________________________________________   LABS (all labs ordered are listed, but only abnormal results are displayed)  Labs Reviewed  COMPREHENSIVE METABOLIC PANEL - Abnormal; Notable for the following:    Potassium 3.4 (*)    Glucose, Bld 126 (*)    BUN 21 (*)    Creatinine, Ser 1.17 (*)    Total Bilirubin 0.2 (*)    GFR calc non Af Amer 43 (*)    GFR calc Af Amer 49 (*)    All other components within normal limits  URINALYSIS COMPLETEWITH MICROSCOPIC ([facility] ONLY) - Abnormal; Notable for the following:    Color, Urine YELLOW (*)    APPearance HAZY (*)    Hgb urine dipstick 1+ (*)    Leukocytes, UA 3+ (*)    Bacteria, UA RARE (*)    Squamous Epithelial / LPF 0-5 (*)    All other components within normal limits  URINE CULTURE  ETHANOL  CBC  URINE DRUG SCREEN, QUALITATIVE ([facility] ONLY)  TROPONIN I  TSH   ____________________________________________  EKG   ____________________________________________  RADIOLOGY  CT shows left nephrolithiasis. There is stable gallbladder wall edema. Any stable pulmonary nodule. There are no acute changes on the  CT. ____________________________________________   PROCEDURES    ____________________________________________   INITIAL IMPRESSION / ASSESSMENT AND PLAN / ED COURSE  Pertinent labs & imaging results that were available during my care of the patient were reviewed by me and considered in my medical decision making (see chart for details).   ____________________________________________   FINAL CLINICAL IMPRESSION(S) / ED DIAGNOSES  Final diagnoses:  Hematuria  UTI (lower urinary tract infection)  Confusion      Nena Polio, MD 07/06/15 2339

## 2015-07-06 NOTE — ED Notes (Signed)
Pt brought in by [facility] police IVC.  Pt has blood in urine and has not been taking medications.  Pt calm and cooperative.

## 2015-07-06 NOTE — ED Notes (Signed)
Report received from [place] in room. No complaints or concerns voiced at this time. No abnormal behavior noted at this time. Will continue to monitor with q15 min checks. [employer] officer in area.

## 2015-07-06 NOTE — ED Notes (Signed)
Pt returned from CT, resting in bed quietly in no distress

## 2015-07-07 DIAGNOSIS — N390 Urinary tract infection, site not specified: Secondary | ICD-10-CM | POA: Diagnosis not present

## 2015-07-07 DIAGNOSIS — F0390 Unspecified dementia without behavioral disturbance: Secondary | ICD-10-CM | POA: Diagnosis not present

## 2015-07-07 MED ORDER — ENSURE ENLIVE PO LIQD
1.0000 | Freq: Three times a day (TID) | ORAL | Status: DC
  Administered 2015-07-07: 237 mL via ORAL

## 2015-07-07 MED ORDER — CEPHALEXIN 500 MG PO CAPS: 500.0000 mg | ORAL_CAPSULE | Freq: Three times a day (TID) | ORAL | Status: DC

## 2015-07-07 NOTE — ED Notes (Signed)
BEHAVIORAL HEALTH ROUNDING Patient sleeping: No. Patient alert and oriented: yes Behavior appropriate: Yes.  ;  Nutrition and fluids offered: Yes  Toileting and hygiene offered: Yes  Sitter present: no Law enforcement present: Yes   

## 2015-07-07 NOTE — ED Notes (Signed)
BEHAVIORAL HEALTH ROUNDING Patient sleeping: Yes.   Patient alert and oriented: not applicable Behavior appropriate: Yes.    Nutrition and fluids offered: No Toileting and hygiene offered: No Sitter present: q15 minute observations Law enforcement present: Yes [employer] 

## 2015-07-07 NOTE — ED Notes (Signed)
Pt in room. No complaints or concerns voiced at this time. No abnormal behavior noted at this time. Will continue to monitor with q15 min checks. ODS officer in area. 

## 2015-07-07 NOTE — ED Notes (Signed)
BEHAVIORAL HEALTH ROUNDING Patient sleeping: No. Patient alert and oriented: yes Behavior appropriate: Yes.  ; If no, describe:  Nutrition and fluids offered: Yes  Toileting and hygiene offered: Yes  Sitter present: yes Law enforcement present: Yes   Niece and son at bedside.

## 2015-07-07 NOTE — ED Notes (Signed)
BEHAVIORAL HEALTH ROUNDING Patient sleeping: Yes.   Patient alert and oriented: not applicable Behavior appropriate: Yes.  ; If no, describe:  Nutrition and fluids offered: Yes  Toileting and hygiene offered: Yes  Sitter present: yes Law enforcement present: Yes  

## 2015-07-07 NOTE — ED Notes (Addendum)
BEHAVIORAL HEALTH ROUNDING Patient sleeping: Yes.   Patient alert and oriented: not applicable Behavior appropriate: Yes.  ; If no, describe:  Nutrition and fluids offered: Yes  Toileting and hygiene offered: Yes  Sitter present: yes Law enforcement present: Yes  

## 2015-07-07 NOTE — ED Notes (Signed)
Rory Percy from DSS called to check on pt. States " I will call back in the morning and talk with case manager". Verified that is okay for the pt and son to stay here overnight, DSS hoping to get guardianship and place pt and son together tomorrow after guardianship obtained. Rory Percy office number [phone]

## 2015-07-07 NOTE — ED Notes (Signed)
Son walked over from [place] to visit with patient.

## 2015-07-07 NOTE — Discharge Instructions (Signed)

## 2015-07-07 NOTE — ED Notes (Signed)
BEHAVIORAL HEALTH ROUNDING Patient sleeping: No. Patient alert and oriented: yes Behavior appropriate: Yes.  ; If no, describe:  Nutrition and fluids offered: Yes  Toileting and hygiene offered: Yes  Sitter present: yes Law enforcement present: Yes  

## 2015-07-07 NOTE — ED Notes (Addendum)

## 2015-07-07 NOTE — ED Provider Notes (Signed)
-----------------------------------------   4:31 PM on 07/07/2015 -----------------------------------------  Social services working on placement for patient and her son. They believe they have found appropriate placement, but are currently awaiting confirmation.  Harvest Dark, MD 07/07/15 [phone]

## 2015-07-07 NOTE — ED Provider Notes (Signed)
Apolonio Schneiders, attending physician, personally viewed and interpreted this EKG  EKG Time: 0017 Rate: 66 Rhythm: NSR Axis: normal Intervals: qtc 444 QRS: narrow, q waves III, aVF ST changes: no st elevation Impression: abnormal ekg   Nance Pear, MD 07/07/15 0021

## 2015-07-07 NOTE — Clinical Social Work Note (Signed)
Clinical Social Work Assessment  Patient Details  Name: Allison Macias MRN: [id number] Date of Birth: Apr 22, 1932  Date of referral:  07/07/15               Reason for consult:  Housing Concerns/Homelessness, Abuse/Neglect                Permission sought to share information with:  Facility Sport and exercise psychologist, Family Supports Permission granted to share information::  Yes, Verbal Permission Granted  Name::      (niece Mindi Curling  [phone])  Agency::     Relationship::     Contact Information:     Housing/Transportation Living arrangements for the past 2 months:  Single Family Home Source of Information:  Patient, Other (Comment Required) (DSS  and niece) Patient Interpreter Needed:    Criminal Activity/Legal Involvement Pertinent to Current Situation/Hospitalization:    Significant Relationships:  Other Family Members, Adult Children Lives with:  Adult Children Do you feel safe going back to the place where you live?  No (DSS has confirmed it is unsafe for patient to return home) Need for family participation in patient care:  No (Coment) (DSS will obtain guardianship)  Care giving concerns:  Concerned that patient and son have no place to live.   Social Worker assessment / plan:  Patient is an 81 year old female brought in under commitment by [place] went to her house and found there was no food and water and the house was condemned, so they took the papers out against both the patient and the patient's 51 year old son who live together.   Call from [place] [phone] with [facility], states DSS was unable to get an emergency protective Order to place patient.  The hearing is scheduled for Wed. Oct. 12th.   DSS states does not have the authority to place patient if they are unwilling to go.  They have verified that patient's home is unsafe to return and they are trying to make arrangements with [facility] to take patient.     CSW confirmed with niece that patient is unable to live with her until placement.  Patient is engaged in conversation with CSW.  spoke to patient and son both are willing to voluntarily go to [facility] until the situation is worked out with their home.  Patient states she can't live outside and is ok to go if her son is willing to go.  CSW spoke to son who is willing to go.  DSS has spoken with the facility and made arrangements for the patient and son to stay at ALF. They have all of patient's medications and will pick up patient and son to transport both to [place].    Employment status:    Insurance information:    PT Recommendations:  Not assessed at this time Information / Referral to community resources:   (housing )  Patient/Family's Response to care:   Patient states she can't live outside and is ok to go if her son is willing to go.  Patient/Family's Understanding of and Emotional Response to Diagnosis, Current Treatment, and Prognosis:  Patient understands she will discharge to [facility] and the DSS worker will transport them.  Emotional Assessment Appearance:  Appears stated age Attitude/Demeanor/Rapport:    Affect (typically observed):  Accepting, Calm, Pleasant Orientation:  Oriented to Self, Oriented to Place, Oriented to  Time, Oriented to Situation Alcohol / Substance use:  Never Used Psych involvement (Current and /or  in the community):  No (Comment)  Discharge Needs  Concerns to be addressed:  Lack of Support, Home Safety Concerns, Homelessness Readmission within the last 30 days:  No Current discharge risk:  Chronically ill, Homeless Barriers to Discharge:  Unsafe home situation   Tery Sanfilippo 07/07/2015, 6:36 PM Casimer Lanius. Latanya Presser, [place] Work Department Emergency Room [phone] 6:44 PM

## 2015-07-07 NOTE — ED Notes (Signed)
BEHAVIORAL HEALTH ROUNDING Patient sleeping: No. Patient alert and oriented: yes Behavior appropriate: Yes.   Nutrition and fluids offered: Yes  Toileting and hygiene offered: Yes  Sitter present: q15 min observations Law enforcement present: Yes [employer] 

## 2015-07-07 NOTE — BHH Counselor (Signed)
Spoke with [place] DSS Allison [phone]). They are pursing Guardianship of the client and her son Allison Macias). Allison Macias is considering the both of them. The only thing that's needed at this time is a TB Screening and the facility will be able to read it.    Received phone call from patient's nurse [name]), patient family member Allison Macias) is wanting to visit her. Writer called DSS to make sure it was okay for the visit. Per  DSS Rory Percy) the family member is okay to visit with the patient. The family member has been instrumental and helpful with the placement process. However, they are not allowed to sign or give any permission on treatment or plan of care.  Wrier updated the patient nurse.  DSS is also requesting, the patient and her son are not told about the out of home placement. They will let them know, alone with the family member. It is best it came from them. Patient does not handle stress and change very well. She has a history of heart attacks. Writer updated patient's nurse [name]).

## 2015-07-07 NOTE — ED Notes (Signed)
Pt taken out to lobby by RN to meet DSS. Pts niece updated and phone number given with pt information. Pt able to ambulate independently and without difficulty. Pt in no acute distress.

## 2015-07-07 NOTE — ED Notes (Signed)
Pt reports enjoying the ensure and verbalizes desire for more. Family has reported pt has had significant decrease in wight due to lack of appetite. MD notified that pt enjoyed and ensure and RN requests ensure be added to pts diet order.

## 2015-07-07 NOTE — ED Notes (Signed)
BEHAVIORAL HEALTH ROUNDING Patient sleeping: Yes.   Patient alert and oriented: not applicable Behavior appropriate: Yes.  ; If no, describe:  Nutrition and fluids offered: Yes  Toileting and hygiene offered: Yes  Sitter present: yes Law enforcement present: Yes  ENVIRONMENTAL ASSESSMENT Potentially harmful objects out of patient reach: Yes.   Personal belongings secured: Yes.   Patient dressed in hospital provided attire only: Yes.   Plastic bags out of patient reach: Yes.   Patient care equipment (cords, cables, call bells, lines, and drains) shortened, removed, or accounted for: Yes.   Equipment and supplies removed from bottom of stretcher: Yes.   Potentially toxic materials out of patient reach: Yes.   Sharps container removed or out of patient reach: Yes.   

## 2015-07-07 NOTE — Consult Note (Signed)
[facility] Face-to-Face Psychiatry Consult   Reason for Consult:  Consult for this 81 year old woman who was brought to the emergency room and placed under involuntary commitment by the Department of Social Services. Referring Physician:  Quale Patient Identification: Allison Macias MRN:  [id number] Principal Diagnosis: Dementia Diagnosis:   Patient Active Problem List   Diagnosis Date Noted  . Dementia [F03.90] 07/07/2015  . HTN (hypertension) [I10] 05/19/2015  . Poverty [Z59.6] 05/19/2015  . UTI (urinary tract infection) [N39.0] 05/18/2015  . HTN (hypertension), benign [I10] 05/18/2015  . CAD (coronary artery disease) [I25.10] 05/18/2015    Total Time spent with patient: 45 minutes  Subjective:   Allison Macias is a 81 y.o. female patient admitted with "I don't have any idea".  HPI:  Case reviewed with psychiatry staff and emergency room physician. Patient interviewed. Chart reviewed. Vital signs reviewed. Labs reviewed. This 81 year old woman has been under the eyes of the Department of Social Services for some time. Evidently they had decided that they want to force placement of her and her adult son because they do not believe that she is taking care of herself appropriately. They filed commitment papers on her. The allegations are simply that her house was unfit to live and also that she has been taking poor care of herself. The patient says that she was at home packing things to move when 2 people from social services showed up and said that she was going to be forced to come with them and called the police on her. Patient denies any symptoms of depression. Denies sad mood. Says that she sleeps all right. Denies hopelessness or helplessness. No suicidal ideation. She denies auditory or visual hallucinations. She does mention of beliefs that she and her family are going to move to the town of [place] although she can't be any more specific about that. She also makes a couple of odd  statements such as telling me that she owns $2 million. Patient says that she takes her medicine when she can afford it.  Past psychiatric history: Only prior psychiatric history was my evaluation of her when she was in the hospital earlier this summer. At that time similar concerns were raised. She really did not even have more than mild dementia at that time. There did not seem to be any indication for psychiatric treatment at that moment. No other psychiatric illness history.  Medical history: History of hypertension possible coronary artery disease and recurrent urinary tract infections  Social history: Patient's only close relative is her adult son who appears to be probably developmentally disabled. Apparently they have both been living at home that leaks and has none of the utilities turned on and is being condemned possibly by the city. I am told that there is a niece who also has been contacted who may be of some help.  Family history: Patient denies any family history of mental illness at all.  Substance abuse history: Patient denies any drug or alcohol abuse currently or in the past.  Past Psychiatric History: Patient has a history of mild dementia but no psychiatric hospitalizations, no suicide attempts no violence  Risk to Self: Suicidal Ideation: No Suicidal Intent: No Is patient at risk for suicide?: No Suicidal Plan?: No Access to Means: No What has been your use of drugs/alcohol within the last 12 months?: N/A How many times?: 0 Other Self Harm Risks: N/A Triggers for Past Attempts: None known Intentional Self Injurious Behavior: None Risk to Others: Homicidal Ideation: No  Thoughts of Harm to Others: No Current Homicidal Intent: No Current Homicidal Plan: No Access to Homicidal Means: No Identified Victim: N/A History of harm to others?: No Assessment of Violence: None Noted Violent Behavior Description: N/A Does patient have access to weapons?: No Criminal Charges  Pending?: No Does patient have a court date: No Prior Inpatient Therapy: Prior Inpatient Therapy: No Prior Therapy Dates: N/A Prior Therapy Facilty/Provider(s): N/A Reason for Treatment: N/A Prior Outpatient Therapy: Prior Outpatient Therapy: No Prior Therapy Dates: N/A Prior Therapy Facilty/Provider(s): N/A Reason for Treatment: N/A Does patient have an ACCT team?: No Does patient have Intensive In-House Services?  : No Does patient have [facility] services? : No Does patient have P4CC services?: No  Past Medical History:  Past Medical History  Diagnosis Date  . Cancer ([place])   . Hypertension     Past Surgical History  Procedure Laterality Date  . Total abdominal hysterectomy     Family History:  Family History  Problem Relation Age of Onset  . Diabetes Sister    Family Psychiatric  History: Patient denies having any family history of mental health or substance abuse problems. Social History:  History  Alcohol Use No     History  Drug Use No    Social History   Social History  . Marital Status: Single    Spouse Name: N/A  . Number of Children: N/A  . Years of Education: N/A   Social History Main Topics  . Smoking status: Never Smoker   . Smokeless tobacco: None  . Alcohol Use: No  . Drug Use: No  . Sexual Activity: Not Asked   Other Topics Concern  . None   Social History Narrative   Additional Social History:    History of alcohol / drug use?: No history of alcohol / drug abuse                     Allergies:  No Known Allergies  Labs:  Results for orders placed or performed during the hospital encounter of 07/06/15 (from the past 48 hour(s))  Comprehensive metabolic panel     Status: Abnormal   Collection Time: 07/06/15  5:44 PM  Result Value Ref Range   Sodium 140 135 - 145 mmol/L   Potassium 3.4 (L) 3.5 - 5.1 mmol/L   Chloride 107 101 - 111 mmol/L   CO2 25 22 - 32 mmol/L   Glucose, Bld 126 (H) 65 - 99 mg/dL   BUN 21 (H) 6 - 20 mg/dL    Creatinine, Ser 1.17 (H) 0.44 - 1.00 mg/dL   Calcium 9.2 8.9 - 10.3 mg/dL   Total Protein 7.3 6.5 - 8.1 g/dL   Albumin 4.2 3.5 - 5.0 g/dL   AST 20 15 - 41 U/L   ALT 19 14 - 54 U/L   Alkaline Phosphatase 75 38 - 126 U/L   Total Bilirubin 0.2 (L) 0.3 - 1.2 mg/dL   GFR calc non Af Amer 43 (L) >60 mL/min   GFR calc Af Amer 49 (L) >60 mL/min    Comment: (NOTE) The eGFR has been calculated using the CKD EPI equation. This calculation has not been validated in all clinical situations. eGFR's persistently <60 mL/min signify possible Chronic Kidney Disease.    Anion gap 8 5 - 15  Ethanol (ETOH)     Status: None   Collection Time: 07/06/15  5:44 PM  Result Value Ref Range   Alcohol, Ethyl (B) <5 <5 mg/dL  Comment:        LOWEST DETECTABLE LIMIT FOR SERUM ALCOHOL IS 5 mg/dL FOR MEDICAL PURPOSES ONLY   CBC     Status: None   Collection Time: 07/06/15  5:44 PM  Result Value Ref Range   WBC 4.0 3.6 - 11.0 K/uL   RBC 4.30 3.80 - 5.20 MIL/uL   Hemoglobin 12.9 12.0 - 16.0 g/dL   HCT 38.9 35.0 - 47.0 %   MCV 90.4 80.0 - 100.0 fL   MCH 29.9 26.0 - 34.0 pg   MCHC 33.1 32.0 - 36.0 g/dL   RDW 13.7 11.5 - 14.5 %   Platelets 183 150 - 440 K/uL  Troponin I     Status: None   Collection Time: 07/06/15  5:44 PM  Result Value Ref Range   Troponin I <0.03 <0.031 ng/mL    Comment:        NO INDICATION OF MYOCARDIAL INJURY.   TSH     Status: None   Collection Time: 07/06/15  5:44 PM  Result Value Ref Range   TSH 2.413 0.350 - 4.500 uIU/mL  Urine Drug Screen, Qualitative ([facility] only)     Status: None   Collection Time: 07/06/15 11:00 PM  Result Value Ref Range   Tricyclic, Ur Screen NONE DETECTED NONE DETECTED   Amphetamines, Ur Screen NONE DETECTED NONE DETECTED   MDMA (Ecstasy)Ur Screen NONE DETECTED NONE DETECTED   Cocaine Metabolite,Ur [place] NONE DETECTED NONE DETECTED   Opiate, Ur Screen NONE DETECTED NONE DETECTED   Phencyclidine (PCP) Ur S NONE DETECTED NONE DETECTED   Cannabinoid  50 Ng, Ur [place] NONE DETECTED NONE DETECTED   Barbiturates, Ur Screen NONE DETECTED NONE DETECTED   Benzodiazepine, Ur Scrn NONE DETECTED NONE DETECTED   Methadone Scn, Ur NONE DETECTED NONE DETECTED    Comment: (NOTE) 761  Tricyclics, urine               Cutoff 1000 ng/mL 200  Amphetamines, urine             Cutoff 1000 ng/mL 300  MDMA (Ecstasy), urine           Cutoff 500 ng/mL 400  Cocaine Metabolite, urine       Cutoff 300 ng/mL 500  Opiate, urine                   Cutoff 300 ng/mL 600  Phencyclidine (PCP), urine      Cutoff 25 ng/mL 700  Cannabinoid, urine              Cutoff 50 ng/mL 800  Barbiturates, urine             Cutoff 200 ng/mL 900  Benzodiazepine, urine           Cutoff 200 ng/mL 1000 Methadone, urine                Cutoff 300 ng/mL 1100 1200 The urine drug screen provides only a preliminary, unconfirmed 1300 analytical test result and should not be used for non-medical 1400 purposes. Clinical consideration and professional judgment should 1500 be applied to any positive drug screen result due to possible 1600 interfering substances. A more specific alternate chemical method 1700 must be used in order to obtain a confirmed analytical result.  1800 Gas chromato graphy / mass spectrometry (GC/MS) is the preferred 1900 confirmatory method.   Urinalysis complete, with microscopic     Status: Abnormal   Collection Time: 07/06/15 11:00 PM  Result  Value Ref Range   Color, Urine YELLOW (A) YELLOW   APPearance HAZY (A) CLEAR   Glucose, UA NEGATIVE NEGATIVE mg/dL   Bilirubin Urine NEGATIVE NEGATIVE   Ketones, ur NEGATIVE NEGATIVE mg/dL   Specific Gravity, Urine 1.009 1.005 - 1.030   Hgb urine dipstick 1+ (A) NEGATIVE   pH 7.0 5.0 - 8.0   Protein, ur NEGATIVE NEGATIVE mg/dL   Nitrite NEGATIVE NEGATIVE   Leukocytes, UA 3+ (A) NEGATIVE   RBC / HPF 0-5 0 - 5 RBC/hpf   WBC, UA TOO NUMEROUS TO COUNT 0 - 5 WBC/hpf   Bacteria, UA RARE (A) NONE SEEN   Squamous Epithelial / LPF  0-5 (A) NONE SEEN  Urine culture     Status: None (Preliminary result)   Collection Time: 07/06/15 11:00 PM  Result Value Ref Range   Specimen Description URINE, RANDOM    Special Requests Normal    Culture NO GROWTH < 12 HOURS    Report Status PENDING     Current Facility-Administered Medications  Medication Dose Route Frequency Provider Last Rate Last Dose  . cephALEXin (KEFLEX) capsule 500 mg  500 mg Oral 3 times per day Nena Polio, MD   500 mg at 07/07/15 1626  . feeding supplement (ENSURE ENLIVE) (ENSURE ENLIVE) liquid 237 mL  1 Bottle Oral TID WC Delman Kitten, MD      . tuberculin injection 5 Units  5 Units Intradermal Once Nena Polio, MD   5 Units at 07/06/15 2315   Current Outpatient Prescriptions  Medication Sig Dispense Refill  . ALPRAZolam (XANAX) 0.25 MG tablet Take 0.25 mg by mouth 2 (two) times daily as needed for sleep.    Marland Kitchen amLODipine (NORVASC) 10 MG tablet Take 10 mg by mouth daily.    Marland Kitchen atorvastatin (LIPITOR) 40 MG tablet Take 40 mg by mouth daily.    . citalopram (CELEXA) 20 MG tablet Take 20 mg by mouth daily.    Marland Kitchen dipyridamole-aspirin (AGGRENOX) 200-25 MG per 12 hr capsule Take 1 capsule by mouth 2 (two) times daily.    . naproxen (NAPROSYN) 375 MG tablet Take 375 mg by mouth 2 (two) times daily with a meal.    . omeprazole (PRILOSEC) 20 MG capsule Take 20 mg by mouth daily.    . cephALEXin (KEFLEX) 500 MG capsule Take 1 capsule (500 mg total) by mouth 2 (two) times daily. (Patient not taking: Reported on 07/07/2015) 6 capsule 0    Musculoskeletal: Strength & Muscle Tone: within normal limits Gait & Station: normal Patient leans: N/A  Psychiatric Specialty Exam: Review of Systems  Constitutional: Negative.   HENT: Negative.   Eyes: Negative.   Respiratory: Negative.   Cardiovascular: Negative.   Gastrointestinal: Negative.   Musculoskeletal: Negative.   Skin: Negative.   Neurological: Negative.   Psychiatric/Behavioral: Negative for depression,  suicidal ideas, hallucinations, memory loss and substance abuse. The patient is not nervous/anxious and does not have insomnia.     Blood pressure 124/71, pulse 70, temperature 98.3 F (36.8 C), temperature source Oral, resp. rate 19, height $RemoveBe'5\' 7"'JYriSyHsR$  (1.702 m), weight 75.751 kg (167 lb), SpO2 98 %.Body mass index is 26.15 kg/(m^2).  General Appearance: Casual  Eye Contact::  Fair  Speech:  Slow  Volume:  Normal  Mood:  Anxious  Affect:  Congruent  Thought Process:  Goal Directed  Orientation:  Full (Time, Place, and Person)  Thought Content:  Possible delusions although I'm not entirely certain.  Suicidal Thoughts:  No  Homicidal Thoughts:  No  Memory:  Immediate;   Fair Recent;   Fair Remote;   Fair  Judgement:  Impaired  Insight:  Shallow  Psychomotor Activity:  Psychomotor Retardation  Concentration:  Fair  Recall:  [place] of Knowledge:Fair  Language: Fair  Akathisia:  No  Handed:  Right  AIMS (if indicated):     Assets:  Communication Skills Desire for Improvement Physical Health Resilience Social Support  ADL's:  Intact  Cognition: Impaired,  Mild  Sleep:      Treatment Plan Summary: Plan This 81 year old woman was placed under involuntary commitment as a way to try to get her out of her home. It's not clear to me how much effort was made to deal with this situation in any other way. Patient has mild dementia. Absolutely not suicidal or homicidal. Commitment grounds her shaky at best. I am working with the staff here in the emergency room. We are hoping we can arrange some kind of plan to get the patient into a safe living situation while DSS continues to work on it. In the meantime I anticipate discontinuing IVC as I don't really think she meets criteria. No specific medication indicated.  Disposition: Patient does not meet criteria for psychiatric inpatient admission. Supportive therapy provided about ongoing stressors.  Allison Macias 07/07/2015 5:39 PM

## 2015-07-10 LAB — URINE CULTURE: SPECIAL REQUESTS: NORMAL

## 2016-03-06 ENCOUNTER — Other Ambulatory Visit: Payer: Self-pay | Admitting: Internal Medicine

## 2016-03-06 DIAGNOSIS — R1030 Lower abdominal pain, unspecified: Secondary | ICD-10-CM

## 2016-03-19 ENCOUNTER — Ambulatory Visit
Admission: RE | Admit: 2016-03-19 | Discharge: 2016-03-19 | Disposition: A | Discharge status: Home / Self Care | Payer: Medicare Other | Source: Ambulatory Visit | Attending: Internal Medicine | Admitting: Internal Medicine

## 2016-03-19 ENCOUNTER — Other Ambulatory Visit: Payer: Self-pay | Admitting: Nurse Practitioner

## 2016-03-19 DIAGNOSIS — R1030 Lower abdominal pain, unspecified: Secondary | ICD-10-CM

## 2016-03-19 DIAGNOSIS — Q6102 Congenital multiple renal cysts: Secondary | ICD-10-CM | POA: Insufficient documentation

## 2016-03-19 DIAGNOSIS — R911 Solitary pulmonary nodule: Secondary | ICD-10-CM | POA: Insufficient documentation

## 2016-03-19 DIAGNOSIS — N200 Calculus of kidney: Secondary | ICD-10-CM | POA: Insufficient documentation

## 2016-03-19 HISTORY — DX: Malignant neoplasm of uterus, part unspecified: C55

## 2016-03-19 HISTORY — DX: Malignant neoplasm of unspecified ovary: C56.9

## 2016-03-19 LAB — POCT I-STAT CREATININE: Creatinine, Ser: 1 mg/dL (ref 0.44–1.00)

## 2016-03-19 MED ORDER — IOPAMIDOL (ISOVUE-300) INJECTION 61%
100.0000 mL | Freq: Once | INTRAVENOUS | Status: AC | PRN
  Administered 2016-03-19: 100 mL via INTRAVENOUS

## 2016-07-08 IMAGING — CR DG CHEST 2V
2 series · 2 of 2 positions shown · non-contrast
Comparison: None.

CLINICAL DATA: Chest pain

EXAM:
CHEST  2 VIEW

[chest pa]
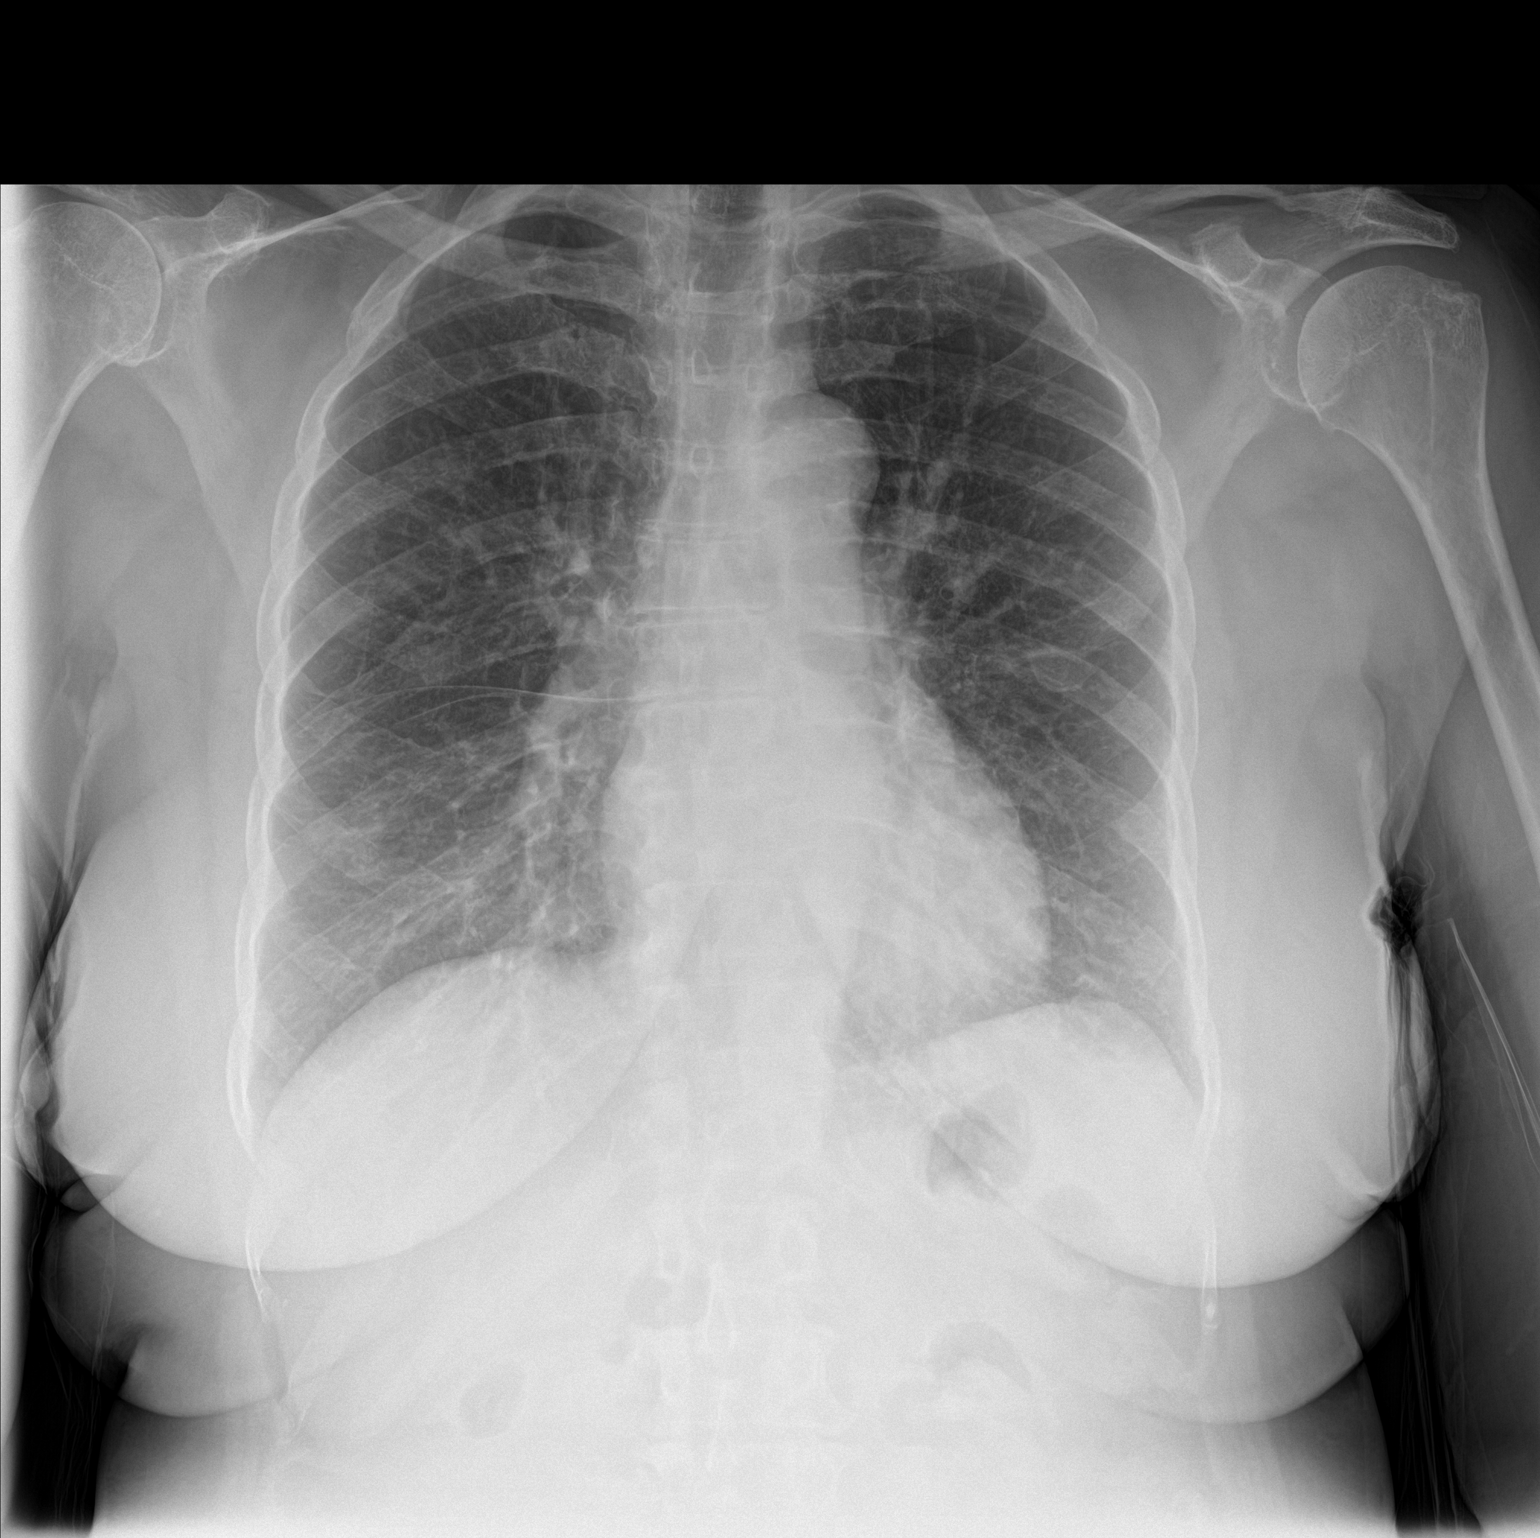

[chest lat]
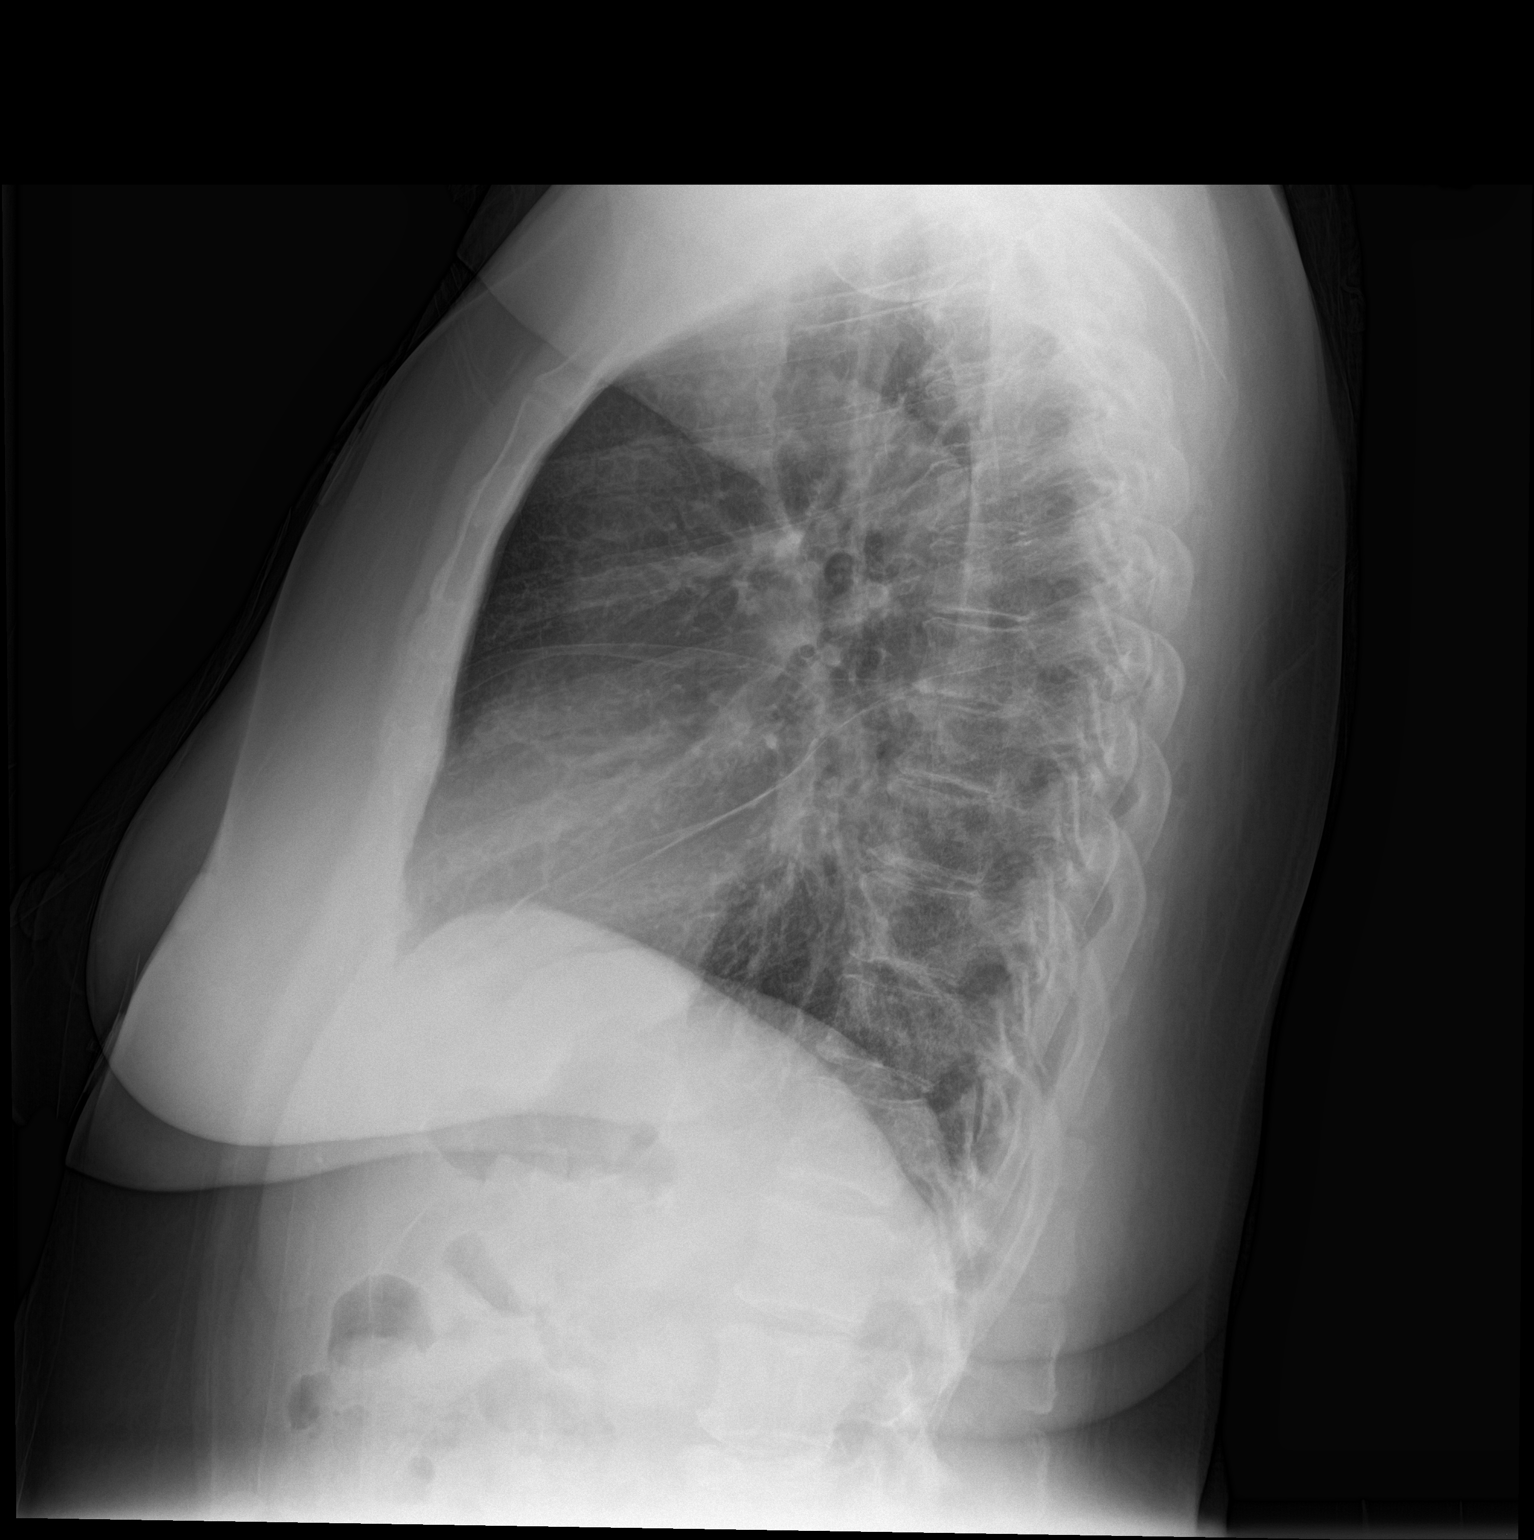

[2 of 2 positions shown; findings below may reference images not displayed]

FINDINGS: Interstitial coarsening which appears bronchitic. There is no edema,
consolidation, effusion, or pneumothorax. Normal heart size and
mediastinal contours. No acute osseous finding.
IMPRESSION: Bronchitic markings.  No consolidation or edema.

## 2016-08-26 IMAGING — CT CT ABD-PELV W/ CM
1 of 3 series · 14 of 32 positions shown, 19 images · IV contrast (omnipaque)
Comparison: Earlier today and 05/10/2015.

CLINICAL DATA: Abdominal pain

EXAM:
CT ABDOMEN AND PELVIS WITH CONTRAST
TECHNIQUE: Multidetector CT imaging of the abdomen and pelvis was performed
using the standard protocol following bolus administration of
intravenous contrast.
CONTRAST:  75mL OMNIPAQUE IOHEXOL 300 MG/ML  SOLN

[Series 2: routine abd pel with · axial · 0.73mm/px · z∈[-474,-98]mm · 14 of 85 slices shown, 19 images]
[im 5/85  soft-tissue]
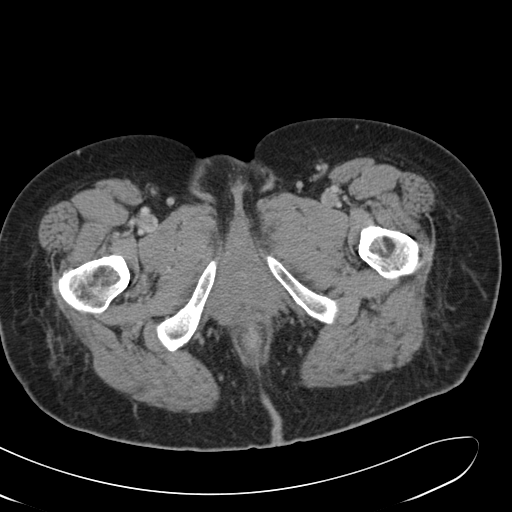
[im 5/85  bone]
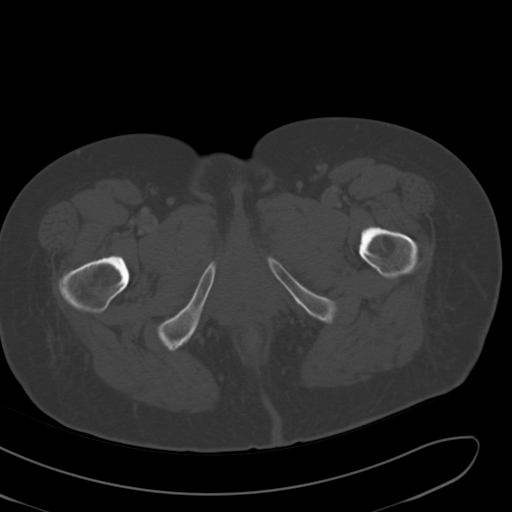
[im 10/85  soft-tissue]
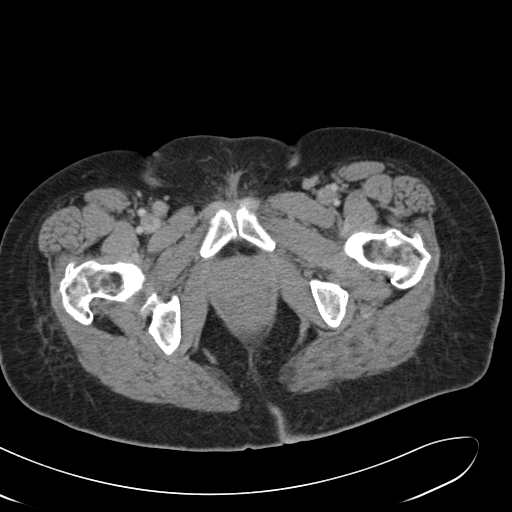
[im 19/85  soft-tissue]
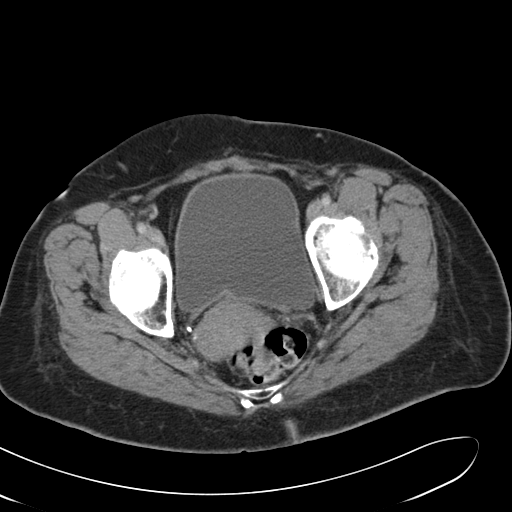
[im 24/85  soft-tissue]
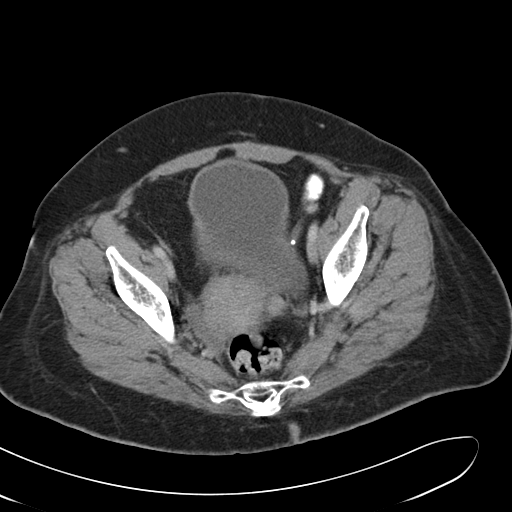
[im 29/85  soft-tissue]
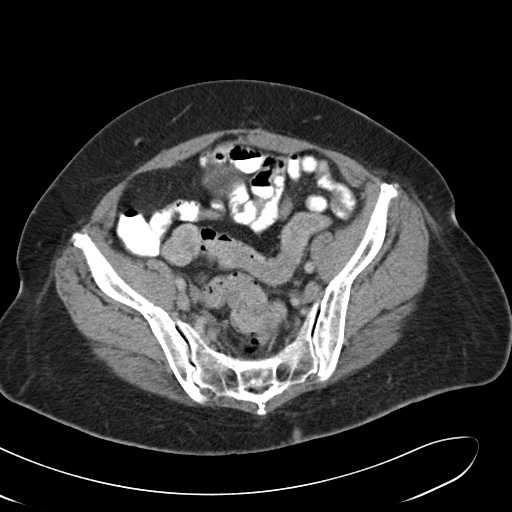
[im 38/85  soft-tissue]
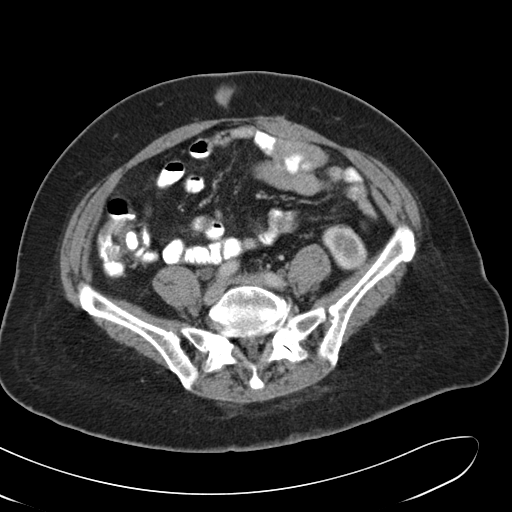
[im 43/85  soft-tissue]
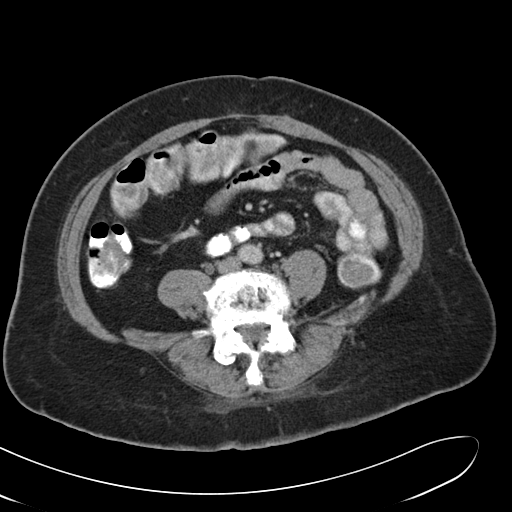
[im 47/85  soft-tissue]
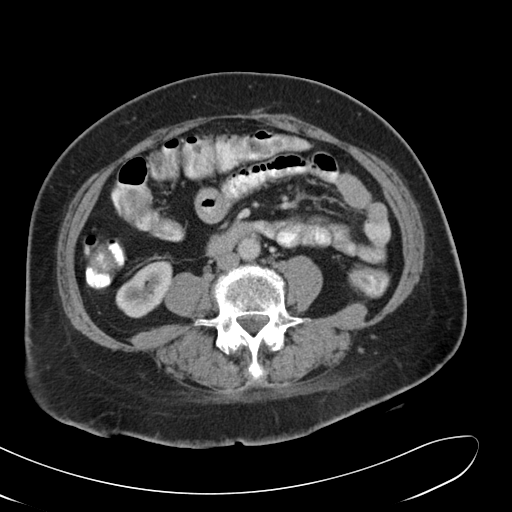
[im 57/85  soft-tissue]
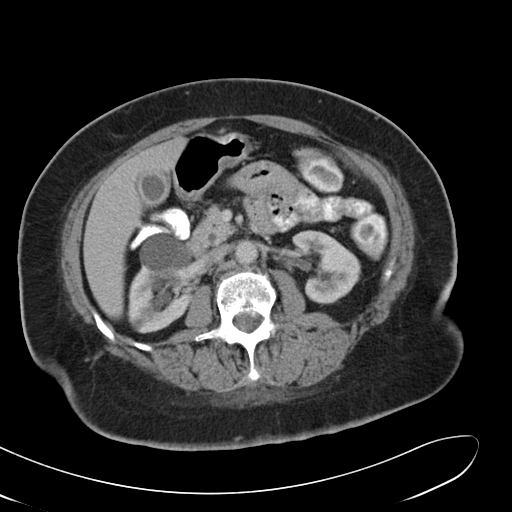
[im 57/85  bone]
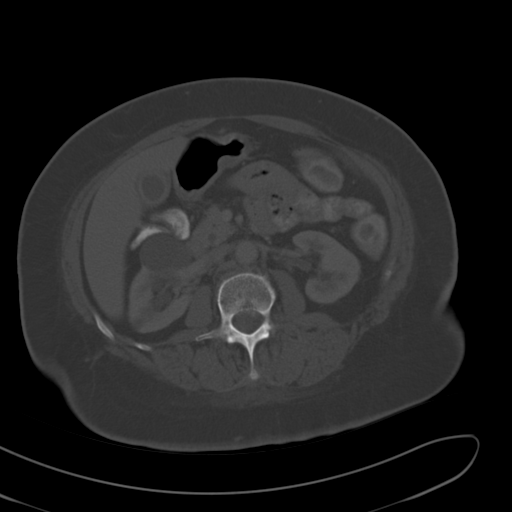
[im 61/85  soft-tissue]
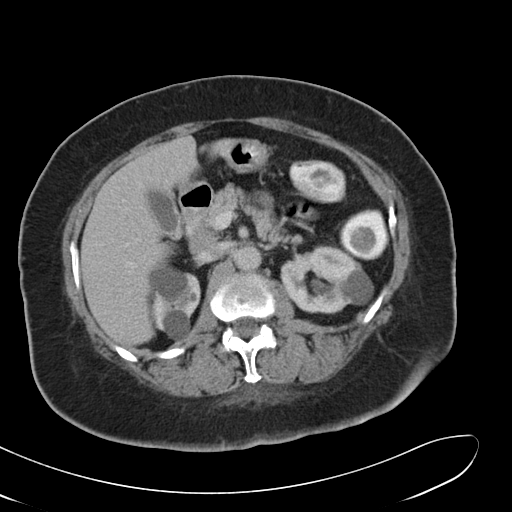
[im 66/85  soft-tissue]
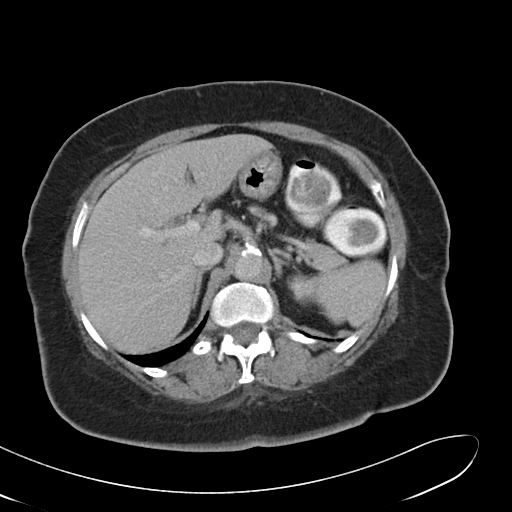
[im 66/85  lung]
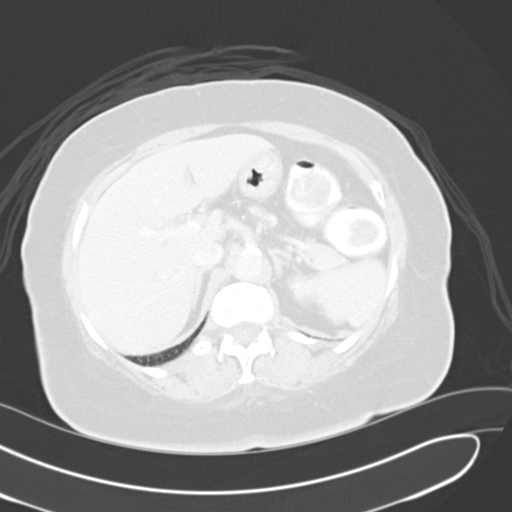
[im 71/85  lung]
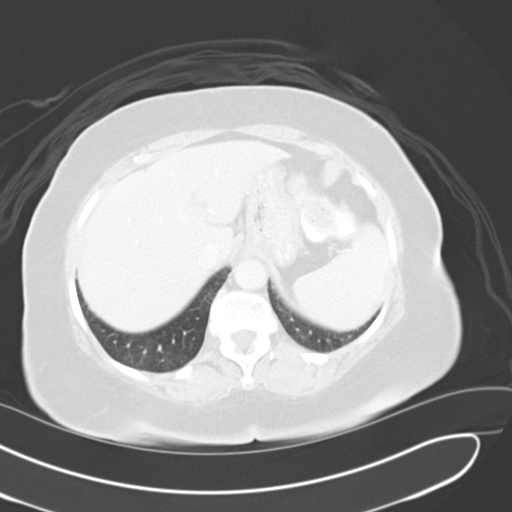
[im 75/85  soft-tissue]
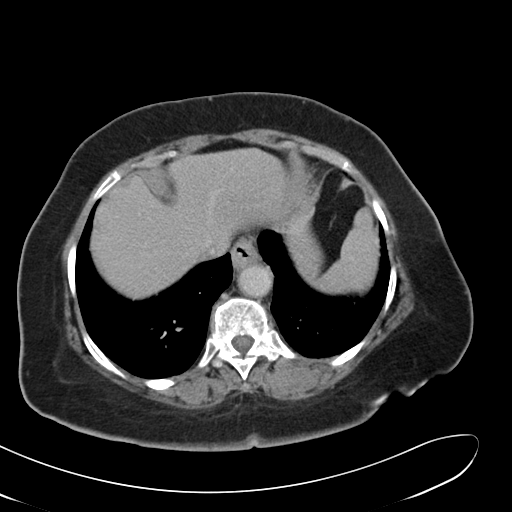
[im 75/85  lung]
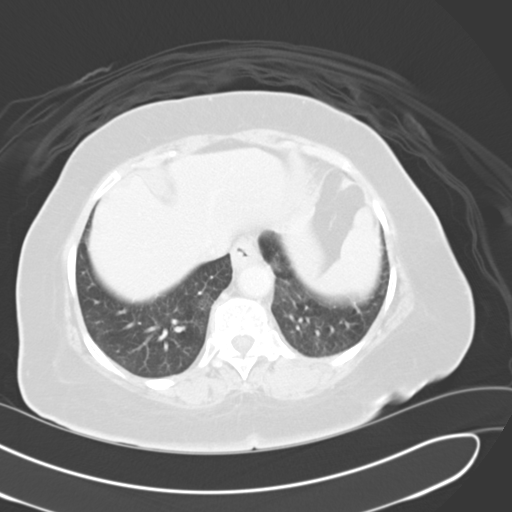
[im 80/85  soft-tissue]
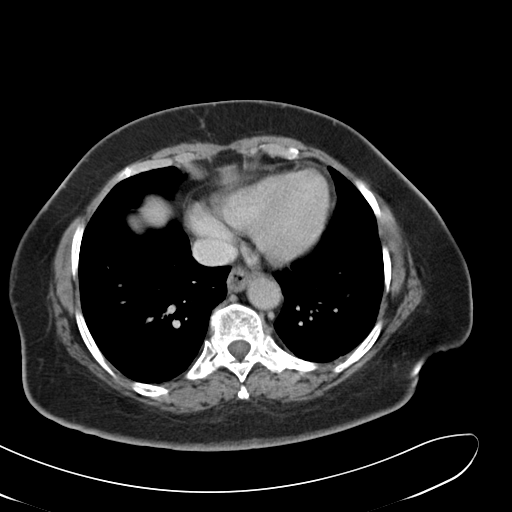
[im 80/85  lung]
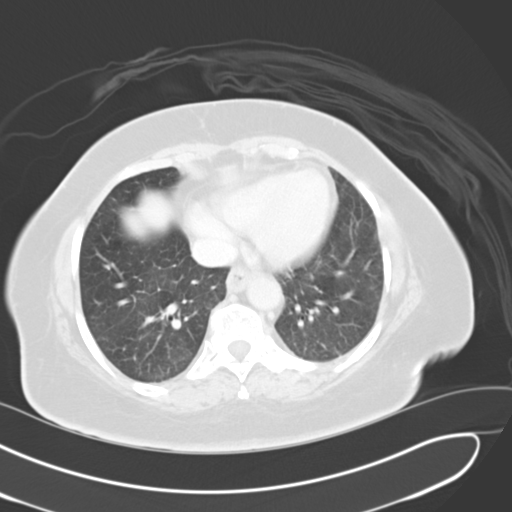

[14 of 32 positions shown; findings below may reference images not displayed]

FINDINGS: Stable lung bases.

Gallbladder wall thickening and edema is stable compared with
05/10/2015.

Liver, spleen, pancreas, adrenal glands are within normal limits

Multiple cysts in the kidneys have a benign appearance. Left
nephrolithiasis is stable.

Bladder is distended. Uterus is unremarkable. Right ovary is seen
adjacent to the uterus on image 61. Adnexa are within normal limits.

No free-fluid.  No abnormal retroperitoneal adenopathy.

Degenerative disc disease in the lumbar spine with multilevel
posterior disc bulging and facet arthropathy.
IMPRESSION: Gallbladder wall edema is nonspecific and stable.

Left nephrolithiasis.

Other findings are stable compared with earlier today.

## 2018-04-18 ENCOUNTER — Emergency Department: Payer: Medicare Other

## 2018-04-18 ENCOUNTER — Encounter: Payer: Self-pay | Admitting: Emergency Medicine

## 2018-04-18 ENCOUNTER — Other Ambulatory Visit: Payer: Self-pay

## 2018-04-18 ENCOUNTER — Emergency Department
Admission: EM | Admit: 2018-04-18 | Discharge: 2018-04-18 | Disposition: A | Discharge status: Home / Self Care | Payer: Medicare Other | Attending: Emergency Medicine | Admitting: Emergency Medicine

## 2018-04-18 DIAGNOSIS — Z8543 Personal history of malignant neoplasm of ovary: Secondary | ICD-10-CM | POA: Diagnosis not present

## 2018-04-18 DIAGNOSIS — N390 Urinary tract infection, site not specified: Secondary | ICD-10-CM

## 2018-04-18 DIAGNOSIS — E119 Type 2 diabetes mellitus without complications: Secondary | ICD-10-CM | POA: Diagnosis not present

## 2018-04-18 DIAGNOSIS — R531 Weakness: Secondary | ICD-10-CM

## 2018-04-18 DIAGNOSIS — F0390 Unspecified dementia without behavioral disturbance: Secondary | ICD-10-CM | POA: Insufficient documentation

## 2018-04-18 DIAGNOSIS — Z8542 Personal history of malignant neoplasm of other parts of uterus: Secondary | ICD-10-CM | POA: Insufficient documentation

## 2018-04-18 DIAGNOSIS — I2510 Atherosclerotic heart disease of native coronary artery without angina pectoris: Secondary | ICD-10-CM | POA: Insufficient documentation

## 2018-04-18 DIAGNOSIS — Z79899 Other long term (current) drug therapy: Secondary | ICD-10-CM | POA: Insufficient documentation

## 2018-04-18 DIAGNOSIS — M6281 Muscle weakness (generalized): Secondary | ICD-10-CM | POA: Diagnosis not present

## 2018-04-18 DIAGNOSIS — I10 Essential (primary) hypertension: Secondary | ICD-10-CM | POA: Diagnosis not present

## 2018-04-18 HISTORY — DX: Hyperlipidemia, unspecified: E78.5

## 2018-04-18 HISTORY — DX: Type 2 diabetes mellitus without complications: E11.9

## 2018-04-18 LAB — CBC WITH DIFFERENTIAL/PLATELET
BASOS ABS: 0 10*3/uL (ref 0–0.1)
BASOS PCT: 1 %
Eosinophils Absolute: 0.2 10*3/uL (ref 0–0.7)
Eosinophils Relative: 6 %
HEMATOCRIT: 33.8 % — AB (ref 35.0–47.0)
HEMOGLOBIN: 11.1 g/dL — AB (ref 12.0–16.0)
LYMPHS PCT: 34 %
Lymphs Abs: 1.1 10*3/uL (ref 1.0–3.6)
MCH: 29.4 pg (ref 26.0–34.0)
MCHC: 33 g/dL (ref 32.0–36.0)
MCV: 89.1 fL (ref 80.0–100.0)
MONO ABS: 0.5 10*3/uL (ref 0.2–0.9)
Monocytes Relative: 16 %
NEUTROS ABS: 1.4 10*3/uL (ref 1.4–6.5)
NEUTROS PCT: 43 %
Platelets: 111 10*3/uL — ABNORMAL LOW (ref 150–440)
RBC: 3.79 MIL/uL — ABNORMAL LOW (ref 3.80–5.20)
RDW: 15.5 % — ABNORMAL HIGH (ref 11.5–14.5)
WBC: 3.2 10*3/uL — ABNORMAL LOW (ref 3.6–11.0)

## 2018-04-18 LAB — URINALYSIS, COMPLETE (UACMP) WITH MICROSCOPIC
BILIRUBIN URINE: NEGATIVE
GLUCOSE, UA: NEGATIVE mg/dL
KETONES UR: NEGATIVE mg/dL
NITRITE: NEGATIVE
PROTEIN: NEGATIVE mg/dL
Specific Gravity, Urine: 1.015 (ref 1.005–1.030)
Squamous Epithelial / LPF: NONE SEEN (ref 0–5)
pH: 5 (ref 5.0–8.0)

## 2018-04-18 LAB — COMPREHENSIVE METABOLIC PANEL
ALT: 14 U/L (ref 0–44)
ANION GAP: 9 (ref 5–15)
AST: 20 U/L (ref 15–41)
Albumin: 3.4 g/dL — ABNORMAL LOW (ref 3.5–5.0)
Alkaline Phosphatase: 92 U/L (ref 38–126)
BILIRUBIN TOTAL: 0.3 mg/dL (ref 0.3–1.2)
BUN: 17 mg/dL (ref 8–23)
CALCIUM: 8.4 mg/dL — AB (ref 8.9–10.3)
CO2: 19 mmol/L — ABNORMAL LOW (ref 22–32)
Chloride: 113 mmol/L — ABNORMAL HIGH (ref 98–111)
Creatinine, Ser: 1.49 mg/dL — ABNORMAL HIGH (ref 0.44–1.00)
GFR, EST AFRICAN AMERICAN: 36 mL/min — AB (ref >60)
GFR, EST NON AFRICAN AMERICAN: 31 mL/min — AB (ref >60)
GLUCOSE: 168 mg/dL — AB (ref 70–99)
POTASSIUM: 3.5 mmol/L (ref 3.5–5.1)
Sodium: 141 mmol/L (ref 135–145)
TOTAL PROTEIN: 6.8 g/dL (ref 6.5–8.1)

## 2018-04-18 LAB — TROPONIN I: Troponin I: <0.03 ng/mL (ref <0.03)

## 2018-04-18 MED ORDER — SODIUM CHLORIDE 0.9 % IV BOLUS
1000.0000 mL | Freq: Once | INTRAVENOUS | Status: AC
  Administered 2018-04-18: 1000 mL via INTRAVENOUS

## 2018-04-18 MED ORDER — CEPHALEXIN 500 MG PO CAPS: 500.0000 mg | ORAL_CAPSULE | Freq: Two times a day (BID) | ORAL | 0 refills | Status: AC

## 2018-04-18 MED ORDER — SODIUM CHLORIDE 0.9 % IV SOLN
1.0000 g | Freq: Once | INTRAVENOUS | Status: AC
  Administered 2018-04-18: 1 g via INTRAVENOUS
  Filled 2018-04-18: qty 10

## 2018-04-18 NOTE — ED Notes (Signed)
Allison Macias from [facility], returned my call and informed us that the patient will need EMS transportation back to their facility.

## 2018-04-18 NOTE — ED Notes (Signed)
Attempting to get in touch with legal guardian at this time.

## 2018-04-18 NOTE — ED Notes (Signed)
ED Provider at bedside. 

## 2018-04-18 NOTE — ED Notes (Signed)
Spoke with Levada Dy at [facility] at [phone] who was informed of the patient's discharge summary and informed that we started her on antibiotics.  Questioned the facility on her transport for discharge.  Levada Dy instructed that she would call the owners of the facility and would give [place] a call back to let us know if they will transport or if she will need EMS.

## 2018-04-18 NOTE — ED Notes (Signed)
EMS called about taking patient back home

## 2018-04-18 NOTE — ED Notes (Signed)
Patient transported to CT 

## 2018-04-18 NOTE — Discharge Instructions (Signed)
Allison Macias has a urinary tract infection.  She should take the antibiotic as prescribed and finish the full course.  She should return to the emergency department for new or worsening lethargy, weakness, confusion, high fevers, vomiting, or any other new or worsening symptoms that concern facility staff.

## 2018-04-18 NOTE — ED Notes (Signed)
Patient assisted to the restroom with one person assistance.  Patient in NAD at this time.

## 2018-04-18 NOTE — ED Notes (Signed)
Patient's legal guardian attempted to be called at this time.  The on-call DSS worker will return my call.

## 2018-04-18 NOTE — ED Provider Notes (Signed)
[facility] Emergency Department Provider Note ____________________________________________   First MD Initiated Contact with Patient 04/18/18 2007     (approximate)  I have reviewed the triage vital signs and the nursing notes.   HISTORY  Chief Complaint Weakness  Level 5 caveat: History of present illness limited due to altered mental status  HPI Allison Macias is a 84 y.o. female with PMH as noted below who presents from her group home with lethargy over the last few hours since she ate dinner this evening.  The patient states she has some pain in her legs but is unable to verbalize any other complaints.   Past Medical History:  Diagnosis Date  . Diabetes mellitus without complication ([place])   . Hyperlipidemia   . Hypertension   . Ovarian cancer ([place])   . Uterine cancer [facility])     Patient Active Problem List   Diagnosis Date Noted  . Dementia 07/07/2015  . HTN (hypertension) 05/19/2015  . Poverty 05/19/2015  . UTI (urinary tract infection) 05/18/2015  . HTN (hypertension), benign 05/18/2015  . CAD (coronary artery disease) 05/18/2015    Past Surgical History:  Procedure Laterality Date  . TOTAL ABDOMINAL HYSTERECTOMY      Prior to Admission medications   Medication Sig Start Date End Date Taking? Authorizing Provider  ALPRAZolam (XANAX) 0.25 MG tablet Take 0.25 mg by mouth 2 (two) times daily as needed for sleep.    [provider]  amLODipine (NORVASC) 10 MG tablet Take 10 mg by mouth daily.    [provider]  atorvastatin (LIPITOR) 40 MG tablet Take 40 mg by mouth daily.    [provider]  cephALEXin (KEFLEX) 500 MG capsule Take 1 capsule (500 mg total) by mouth 2 (two) times daily for 10 days. 04/19/18 04/29/18  Arta Silence, MD  citalopram (CELEXA) 20 MG tablet Take 20 mg by mouth daily.    [provider]  dipyridamole-aspirin (AGGRENOX) 200-25 MG per 12 hr capsule Take 1 capsule by mouth  2 (two) times daily.    [provider]  naproxen (NAPROSYN) 375 MG tablet Take 375 mg by mouth 2 (two) times daily with a meal.    [provider]  omeprazole (PRILOSEC) 20 MG capsule Take 20 mg by mouth daily.    [provider]    Allergies Patient has no known allergies.  Family History  Problem Relation Age of Onset  . Diabetes Sister     Social History Social History   Tobacco Use  . Smoking status: Never Smoker  . Smokeless tobacco: Never Used  Substance Use Topics  . Alcohol use: No  . Drug use: No    Review of Systems Level 5 caveat: Unable to obtain review of systems due to altered mental status    ____________________________________________   PHYSICAL EXAM:  VITAL SIGNS: ED Triage Vitals  Enc Vitals Group     BP --      Pulse Rate 04/18/18 2007 60     Resp 04/18/18 2007 20     Temp 04/18/18 2007 97.6 F (36.4 C)     Temp Source 04/18/18 2007 Oral     SpO2 --      Weight 04/18/18 2004 168 lb (76.2 kg)     Height 04/18/18 2004 5\' 5"  (1.651 m)     Head Circumference --      Peak Flow --      Pain Score 04/18/18 2004 0  Pain Loc --      Pain Edu? --      Excl. in [place]? --     Constitutional: Somewhat lethargic appearing, but awake and following commands.  Oriented x2. Eyes: Conjunctivae are normal.  EOMI.  PERRLA. Head: Atraumatic. Nose: No congestion/rhinnorhea. Mouth/Throat: Mucous membranes are m dry.   Neck: Normal range of motion.  Cardiovascular: Normal rate, regular rhythm. Grossly normal heart sounds.  Good peripheral circulation. Respiratory: Normal respiratory effort.  No retractions. Lungs CTAB. Gastrointestinal: Soft and nontender. No distention.  Genitourinary: No flank tenderness. Musculoskeletal: No lower extremity edema.  Extremities warm and well perfused.  Neurologic: Motor intact in all extremities.  No gross focal neurologic deficits are appreciated.  Skin:  Skin is warm and dry. No rash  noted. Psychiatric: Unable to assess.  ____________________________________________   LABS (all labs ordered are listed, but only abnormal results are displayed)  Labs Reviewed  COMPREHENSIVE METABOLIC PANEL - Abnormal; Notable for the following components:      Result Value   Chloride 113 (*)    CO2 19 (*)    Glucose, Bld 168 (*)    Creatinine, Ser 1.49 (*)    Calcium 8.4 (*)    Albumin 3.4 (*)    GFR calc non Af Amer 31 (*)    GFR calc Af Amer 36 (*)    All other components within normal limits  CBC WITH DIFFERENTIAL/PLATELET - Abnormal; Notable for the following components:   WBC 3.2 (*)    RBC 3.79 (*)    Hemoglobin 11.1 (*)    HCT 33.8 (*)    RDW 15.5 (*)    Platelets 111 (*)    All other components within normal limits  URINALYSIS, COMPLETE (UACMP) WITH MICROSCOPIC - Abnormal; Notable for the following components:   Color, Urine YELLOW (*)    APPearance HAZY (*)    Hgb urine dipstick SMALL (*)    Leukocytes, UA TRACE (*)    Bacteria, UA MANY (*)    All other components within normal limits  TROPONIN I   ____________________________________________  EKG  ED ECG REPORT I, Arta Silence, the attending physician, personally viewed and interpreted this ECG.  Date: 04/18/2018 EKG Time: 2012 Rate: 55 Rhythm: normal sinus rhythm QRS Axis: normal Intervals: normal ST/T Wave abnormalities: normal Narrative Interpretation: no evidence of acute ischemia  ____________________________________________  RADIOLOGY  CT head: No ICH or other acute findings CXR: No focal infiltrate  ____________________________________________   PROCEDURES  Procedure(s) performed: No  Procedures  Critical Care performed: No ____________________________________________   INITIAL IMPRESSION / ASSESSMENT AND PLAN / ED COURSE  Pertinent labs & imaging results that were available during my care of the patient were reviewed by me and considered in my medical decision making  (see chart for details).  84 year old female with PMH as noted above including dementia, hypertension, diabetes, and CAD presents with increased lethargy over the last few hours since she ate dinner.  When asked if she had any pain, the patient reported some discomfort in her legs but was not able to qualify it further.  Her baseline mental status is unknown as EMS stated that the person giving them information at the group home had not been there for a long time.  I reviewed the past medical records in epic; the patient was most recently seen in the ED in 2016 after the patient was found to be unable to take care of herself and was sent for placement.  On exam, the  patient appears somewhat lethargic but is awake and is answering some questions and following commands.  Her vital signs are normal.  Her neuro exam is nonfocal, and there is no evidence of trauma.  Differential includes side effects of medication including Seroquel, dehydration or other metabolic etiology, UTI or other infectious cause, or less likely primary CNS cause.  We will obtain lab work-up, chest x-ray and UA, CT head, and reassess.  ----------------------------------------- 10:48 PM on 04/18/2018 -----------------------------------------  The patient's UA is consistent with a UTI.  The remainder of her lab work-up is unremarkable except for minimally elevated creatinine, although the most recent labs we have on her are from 2 years ago.  CT head shows no concerning acute findings and the chest x-ray is negative.  On reassessment, the patient is much more alert and conversant.  Although her baseline is unclear, she does have a history of dementia and her current mental status appears consistent.   Given that the patient is alert, has normal vital signs, reassuring labs, and no evidence for sepsis, there is no indication for admission at this time.  We will treat for UTI as an outpatient.The RN contacted the group home who were  comfortable taking her back.   ____________________________________________   FINAL CLINICAL IMPRESSION(S) / ED DIAGNOSES  Final diagnoses:  Generalized weakness  Urinary tract infection without hematuria, site unspecified      NEW MEDICATIONS STARTED DURING THIS VISIT:  New Prescriptions   CEPHALEXIN (KEFLEX) 500 MG CAPSULE    Take 1 capsule (500 mg total) by mouth 2 (two) times daily for 10 days.     Note:  This document was prepared using Dragon voice recognition software and may include unintentional dictation errors.    Arta Silence, MD 04/18/18 2250

## 2018-04-18 NOTE — ED Triage Notes (Signed)
Pt comes into the ED via [facility] from [place] c/o lethargy.  Pt has had increased lethargy since dinner tonight.  VSS at this time and CBG 145.  Patient in NAD at this time with even and unlabored respirations.  Patient has hyperlipidemia, HTN, and DM.

## 2018-04-18 NOTE — ED Notes (Signed)
E-signature pad unavailable at this time.  Patient has h/o dementia but was instructed of discharge instructions as well as her group home representative Levada Dy).  Levada Dy verbalized understanding of all discharge instructions and prescriptions.

## 2018-04-18 NOTE — ED Notes (Signed)
Allison Macias with DSS returned my call cat this time and was informed that the patient was being treated and discharged by our facility and that the group home had already been updated in the matter.  Allison Macias verbalized understanding of this information and was in agreement with the plan of care.

## 2019-04-02 ENCOUNTER — Non-Acute Institutional Stay: Payer: Medicare Other | Admitting: Nurse Practitioner

## 2019-04-02 ENCOUNTER — Encounter: Payer: Self-pay | Admitting: Nurse Practitioner

## 2019-04-02 VITALS — BP 114/62 | HR 62 | Temp 97.6°F | Resp 18 | Ht 66.0 in | Wt 122.5 lb

## 2019-04-02 DIAGNOSIS — R630 Anorexia: Secondary | ICD-10-CM | POA: Insufficient documentation

## 2019-04-02 DIAGNOSIS — Z515 Encounter for palliative care: Secondary | ICD-10-CM

## 2019-04-02 DIAGNOSIS — R413 Other amnesia: Secondary | ICD-10-CM | POA: Insufficient documentation

## 2019-04-02 NOTE — Progress Notes (Signed)
[facility] Consult Note Telephone: [phone]  Fax: [phone]  PATIENT NAME: Allison Macias DOB: [birth year] MRN: [id number]  PRIMARY CARE PROVIDER:   Leeroy Cha, MD  REFERRING PROVIDER:  Dr [facility] RESPONSIBLE PARTY:   Delton See [id number] [phone]  I was asked by Dr Nyra Capes to see Allison Macias for [facility] consult for [place] and PLAN:  1.Palliative care encounter Z51.5; Palliative medicine team will continue to support patient, patient's family, and medical team. Visit consisted of counseling and education dealing with the complex and emotionally intense issues of symptom management and palliative care in the setting of serious and potentially life-threatening illness  2. Memory loss R41.3 appears progressive. Medical goals to continue to focus on Comfort, redirecting with supportive measures.  3. Anorexia R63.0 but appetite remaining declined. Continue to encourage supplements and comfort feedings.   ASSESSMENT:     3 / 09/2018 weight 144.2 lbs 12/30/2018 weight 135.4 lbs 5 / 09/2018 weight 130.2 lbs 6 / 29 /2020 weight 122.5 lbs BMI 19.8  6 / 24 / 2020 WBC 4.1, hemoglobin 11.5, hematocrit 34.3, platelets 123, sodium 143, potassium 3.8, chloride 103, Co2 27, bun 17.4, creatinine 1.04, glucose 78, albumin  3.1, total protein 6.0  I visited and observe Allison Macias. We talked about purpose of palliative care visit. She did make eye contact and was cooperative with conversation though limited with cognitive impairment. She had her suitcase packed next to her. She shared that someone was coming to pick her up. She was cooperative with the assessment. Attempted to ask questions about symptoms, appetite but very limited in the able to answer. I will contact Guardian for further discussion of goals of care, code status. Updated nursing staff no new changes at present time.   I spent 60  minutes providing this consultation,  from 2:00pm to 3:00pm. More than 50% of the time in this consultation was spent coordinating communication.   HISTORY OF PRESENT ILLNESS:  Allison Macias is a 85 y.o. year old female with multiple medical problems including  Late onset CVA, arthosclerosis, dementia, hypertension, anemia, chronic kidney disease, Diabetes Type 2, gerd, hyperlipidemia, glaucoma, hearing impairment, constipation. She resides at [place] [facility]. She does ambulate with a walker as desired. She does require assistance with ADLs. She does have episodes of urinary incontinence. She does feed herself after tray setup with a fair to poor appetite for staff. She has been losing weight. She is on a regular diet, let regular texture, regular liquid consistency. Care plan meeting was held 61 / 25 / 2020 with no changes to current goals are plan of care. She is alert and oriented to self, confused, pleasant. At time she is seen wandering and caring her suitcase with her while she is using her walker. Staff endorses she feels like someone is going to pick her up to go out of the facility. Unable to participate in mmse. 6 / 2 / 2020 for last primary provider note for initial [employer] history and physical. No recent wounds, infections, hospitalizations. She had a fall on 6 / 9 / 2020. At present she is sitting in the chair in her room. She appears then, elderly but comfortable, confused. No visitors present. Palliative Care was asked to help address goals of care.   CODE STATUS: full code  PPS: 40% HOSPICE ELIGIBILITY/DIAGNOSIS: TBD  PAST MEDICAL HISTORY:  Past Medical History:  Diagnosis Date  .  Diabetes mellitus without complication ([place])   . Hyperlipidemia   . Hypertension   . Ovarian cancer ([place])   . Uterine cancer ([place])     SOCIAL HX:  Social History   Tobacco Use  . Smoking status: Never Smoker  . Smokeless tobacco: Never Used  Substance  Use Topics  . Alcohol use: No    ALLERGIES: No Known Allergies   PERTINENT MEDICATIONS:  Outpatient Encounter Medications as of 04/02/2019  Medication Sig  . acetaminophen (TYLENOL) 325 MG tablet Take 650 mg by mouth every 6 (six) hours as needed.  Marland Kitchen allopurinol (ZYLOPRIM) 100 MG tablet Take 100 mg by mouth daily.  Marland Kitchen aspirin 81 MG chewable tablet Chew by mouth daily.  . Brimonidine Tartrate 0.025 % SOLN Apply 1 drop to eye 3 (three) times daily.  . Calcium Carbonate Antacid (CALCIUM CARBONATE PO) Take 1,000 mg by mouth 3 (three) times daily.  . cloNIDine (CATAPRES - DOSED IN MG/24 HR) 0.1 mg/24hr patch Place 0.1 mg onto the skin once a week.  . docusate sodium (COLACE) 100 MG capsule Take 100 mg by mouth 2 (two) times daily.  . insulin glargine (LANTUS) 100 UNIT/ML injection Inject 10 Units into the skin at bedtime.  . isosorbide mononitrate (IMDUR) 60 MG 24 hr tablet Take 60 mg by mouth daily.  Marland Kitchen latanoprost (XALATAN) 0.005 % ophthalmic solution Place 1 drop into both eyes at bedtime.  Marland Kitchen lisinopril (ZESTRIL) 40 MG tablet Take 40 mg by mouth daily.  . Melatonin 5 MG CAPS Take 5 mg by mouth at bedtime.  . metoprolol succinate (TOPROL-XL) 25 MG 24 hr tablet Take 25 mg by mouth 2 (two) times a day.  Marland Kitchen omeprazole (PRILOSEC) 20 MG capsule Take 20 mg by mouth daily.  . polyethylene glycol (MIRALAX / GLYCOLAX) 17 g packet Take 17 g by mouth daily.  . sertraline (ZOLOFT) 25 MG tablet Take 25 mg by mouth daily.  . sitaGLIPtin (JANUVIA) 25 MG tablet Take 25 mg by mouth at bedtime.  . [DISCONTINUED] ALPRAZolam (XANAX) 0.25 MG tablet Take 0.25 mg by mouth 2 (two) times daily as needed for sleep.  . [DISCONTINUED] amLODipine (NORVASC) 10 MG tablet Take 10 mg by mouth daily.  . [DISCONTINUED] atorvastatin (LIPITOR) 40 MG tablet Take 40 mg by mouth daily.  . [DISCONTINUED] citalopram (CELEXA) 20 MG tablet Take 20 mg by mouth daily.  . [DISCONTINUED] dipyridamole-aspirin (AGGRENOX) 200-25 MG per 12 hr  capsule Take 1 capsule by mouth 2 (two) times daily.  . [DISCONTINUED] naproxen (NAPROSYN) 375 MG tablet Take 375 mg by mouth 2 (two) times daily with a meal.   No facility-administered encounter medications on file as of 04/02/2019.     PHYSICAL EXAM:   General: NAD, frail appearing, thin elderly pleasantly confused Cardiovascular: regular rate and rhythm Pulmonary: clear ant fields Abdomen: soft, nontender, + bowel sounds GU: no suprapubic tenderness Extremities: no edema, no joint deformities Skin: no rashes Neurological: Weakness but otherwise nonfocal  Graison Leinberger Ihor Gully, NP

## 2019-04-05 ENCOUNTER — Other Ambulatory Visit: Payer: Self-pay

## 2019-04-21 ENCOUNTER — Non-Acute Institutional Stay: Payer: Medicare Other | Admitting: Nurse Practitioner

## 2019-04-21 ENCOUNTER — Encounter: Payer: Self-pay | Admitting: Nurse Practitioner

## 2019-04-21 VITALS — BP 122/60 | HR 97 | Temp 97.2°F | Resp 18 | Ht 66.0 in | Wt 122.5 lb

## 2019-04-21 DIAGNOSIS — Z515 Encounter for palliative care: Secondary | ICD-10-CM

## 2019-04-21 DIAGNOSIS — R413 Other amnesia: Secondary | ICD-10-CM

## 2019-04-21 DIAGNOSIS — R630 Anorexia: Secondary | ICD-10-CM

## 2019-04-21 NOTE — Progress Notes (Signed)
[facility] Telephone: [phone]  Fax: [phone]  PATIENT NAME: Allison Macias DOB: 04/02/1932 MRN: [id number]  PRIMARY CARE PROVIDER:   Leeroy Cha, MD  REFERRING PROVIDER: Dr [facility] RESPONSIBLE PARTY:   Allison Macias Guardian [phone]  RECOMMENDATIONS and PLAN:  1.Palliative care encounter Z51.5; Palliative medicine team will continue to support patient, patient's family, and medical team. Visit consisted of counseling and education dealing with the complex and emotionally intense issues of symptom management and palliative care in the setting of serious and potentially life-threatening illness  2. Memory loss R41.3 appears progressive. Medical goals to continue to focus on Comfort, redirecting with supportive measures.  3. Anorexia R63.0 but appetite remaining declined. Continue to encourage supplements and comfort feedings.    ASSESSMENT:     I have zoom video with Allison Macias guardian and in the presence of Allison Macias at the facility. We talked about purpose for palliative care visit. Allison Macias did talk with Allison Macias. She was sleepy but cooperative. She was quiet during palliative care visit. She did verbalize delusions of feeling like she dying and that someone was going to hurt her. Reassured Allison Macias that she is safe. We talked about her appetite. Little discussion with Allison Macias as she ruminated over death. I further discuss with Allison Macias that will follow up with psychiatric nurse practitioner for increase in delusions and medication possible adjustment. We talked about isolation with covid pandemic and no visitors allowed in the facility. We talked about the option of Allison Macias zooming with Allison Macias, her son and himself. We talked about scheduling a care plan meeting for further discussion of how to increase her social interaction and put on a routine  schedule. We talked about importance of socialization, interaction. Allison Macias was going to ask Allison Macias about her wishes for resuscitation but did not pursue with her delusions and felt like It wasn't the right time to bring that up. We scheduled a follow-up palliative care visit to zoom in 4 weeks. Questions answered satisfaction. Mr Pablo Macias will follow up with social worker to schedule care plan meeting and zoom meeting with her son as he resides in a group home. I updated nursing staff. Any changes to current goals or plan of care at present time.  I spent 35 minutes providing this consultation,  from 11:00am to 11:35am. More than 50% of the time in this consultation was spent coordinating communication.   HISTORY OF PRESENT ILLNESS:  Allison Macias is a 85 y.o. year old female with multiple medical problems including Late onset CVA, arthosclerosis, dementia, hypertension, anemia, chronic kidney disease, Diabetes Type 2, gerd, hyperlipidemia, glaucoma, hearing impairment, constipation. She continues to reside at skilled [place] at [facility]. She does require assistance transferring to a wheelchair but is able to set up. She always has her suitcase packed. She does require assistance and prompting for adl's. She is able to feed herself but staff endorses not much of an appetite. She has had a slight weight gain , BMI 19.8. She has been more quiet lately in her room. Staff endorses delusions have become worse. She is cognitively impaired, difficulty answering questions, oriented to self. No recent wounds, infections, hospitalizations. She did have a fallen 6 / 9 / 2020 with no noted injury. She does remain a full code with aggressive interventions. Palliative care visit with Allison Macias and Allison Macias, Guardian to [name]  For palliative care visit. At present Allison Macias is lying in bed, woke for meeting. She does appear comfortable. No visitors present.  Palliative Care was asked to help to continue to address goals of care.   CODE STATUS: Full code  PPS: 40% HOSPICE ELIGIBILITY/DIAGNOSIS: TBD  PAST MEDICAL HISTORY:  Past Medical History:  Diagnosis Date  . Diabetes mellitus without complication ([place])   . Hyperlipidemia   . Hypertension   . Ovarian cancer ([place])   . Uterine cancer ([place])     SOCIAL HX:  Social History   Tobacco Use  . Smoking status: Never Smoker  . Smokeless tobacco: Never Used  Substance Use Topics  . Alcohol use: No    ALLERGIES: No Known Allergies   PERTINENT MEDICATIONS:  Outpatient Encounter Medications as of 04/21/2019  Medication Sig  . acetaminophen (TYLENOL) 325 MG tablet Take 650 mg by mouth every 6 (six) hours as needed.  Marland Kitchen allopurinol (ZYLOPRIM) 100 MG tablet Take 100 mg by mouth daily.  Marland Kitchen aspirin 81 MG chewable tablet Chew by mouth daily.  . Brimonidine Tartrate 0.025 % SOLN Apply 1 drop to eye 3 (three) times daily.  . Calcium Carbonate Antacid (CALCIUM CARBONATE PO) Take 1,000 mg by mouth 3 (three) times daily.  . cloNIDine (CATAPRES - DOSED IN MG/24 HR) 0.1 mg/24hr patch Place 0.1 mg onto the skin once a week.  . docusate sodium (COLACE) 100 MG capsule Take 100 mg by mouth 2 (two) times daily.  . insulin glargine (LANTUS) 100 UNIT/ML injection Inject 10 Units into the skin at bedtime.  . isosorbide mononitrate (IMDUR) 60 MG 24 hr tablet Take 60 mg by mouth daily.  Marland Kitchen latanoprost (XALATAN) 0.005 % ophthalmic solution Place 1 drop into both eyes at bedtime.  Marland Kitchen lisinopril (ZESTRIL) 40 MG tablet Take 40 mg by mouth daily.  . Melatonin 5 MG CAPS Take 5 mg by mouth at bedtime.  . metoprolol succinate (TOPROL-XL) 25 MG 24 hr tablet Take 25 mg by mouth 2 (two) times a day.  Marland Kitchen omeprazole (PRILOSEC) 20 MG capsule Take 20 mg by mouth daily.  . polyethylene glycol (MIRALAX / GLYCOLAX) 17 g packet Take 17 g by mouth daily.  . sertraline (ZOLOFT) 25 MG tablet Take 25 mg by mouth daily.  . sitaGLIPtin  (JANUVIA) 25 MG tablet Take 25 mg by mouth at bedtime.   No facility-administered encounter medications on file as of 04/21/2019.     PHYSICAL EXAM:   General: NAD, frail appearing, thin, debilitated, oriented to self female Cardiovascular: regular rate and rhythm Pulmonary: clear ant fields Abdomen: soft, nontender, + bowel sounds Extremities: no edema, no joint deformities Skin: no rashes Neurological: Weakness but otherwise nonfocal/non-ambulatory   Ihor Gully, NP

## 2019-04-22 ENCOUNTER — Other Ambulatory Visit: Payer: Self-pay

## 2019-05-19 ENCOUNTER — Non-Acute Institutional Stay: Payer: Medicare Other | Admitting: Nurse Practitioner

## 2019-05-19 ENCOUNTER — Encounter: Payer: Self-pay | Admitting: Nurse Practitioner

## 2019-05-19 VITALS — BP 142/64 | HR 68 | Temp 98.0°F | Resp 18 | Wt 120.0 lb

## 2019-05-19 DIAGNOSIS — Z515 Encounter for palliative care: Secondary | ICD-10-CM

## 2019-05-19 NOTE — Progress Notes (Signed)
[place] Consult Note Telephone: [phone]  Fax: [phone]  PATIENT NAME: Allison Macias DOB: 03-30-1932 MRN: [id number]  PRIMARY CARE PROVIDER:   Leeroy Cha, MD  REFERRING PROVIDER:  Dr [facility] RESPONSIBLE PARTY:   Delton See Guardian [phone]  RECOMMENDATIONS and PLAN:  1. ACP: Full code with ongoing discussions of DNR/MOST form sent blank to [place] for further family discussion; PC offerred to have further discussion with Delton See and family concerning goc, code status  2. Memory loss R41.3 appears progressive. Medical goals to continue to focus on Comfort, redirecting with supportive measures.  3. Anorexia R63.0 but appetite remaining declined. Continue to encourage supplements and comfort feedings.   4. Palliative care encounter Z51.5; Palliative medicine team will continue to support patient, patient's family, and medical team. Visit consisted of counseling and education dealing with the complex and emotionally intense issues of symptom management and palliative care in the setting of serious and potentially life-threatening illness  3 / 09/2018 weight 144.2 lbs 12/30/2018 weight 135.4 lbs 5 / 09/2018 weight 130.2 lbs 6 / 25 / 2020 weight 1 17.1 lbs 8/7 / 2020 weight 120 lbs  I spent 45 minutes providing this consultation,  from 1:00pm to 1:45pm. More than 50% of the time in this consultation was spent coordinating communication.   HISTORY OF PRESENT ILLNESS:  Allison Macias is a 85 y.o. year old female with multiple medical problems including Late onset CVA, arthosclerosis, dementia, hypertension, anemia, chronic kidney disease, Diabetes Type 2, gerd, hyperlipidemia, glaucoma, hearing impairment, constipation. Allison Macias continues to reside at [facility]. She does set up on the side of  the bed and is able to sit up on her own. She is total ADL dependence. She has episodes of incontinence. She does feed herself after tray set up an appetite has remained declined though slight weight increase. She is cognitively impaired and has difficulty verbalizing her needs at times. She is followed by Psychiatry at the facility with initial consult 7 / 30 / 2020 for mood disorder, insomnia and likely vascular dementia. She currently takes melatonin for sleep and Zoloft for mood. She does remain confused with delusions but no behavioral concerns. She has low energy, low motivation, worry, short and long term memory loss with confusion and delusions with symptoms present for them more than a few months. No medication adjustments at this visit. No recent Falls, wounds, infections, hospitalizations. Staff endorses she is cooperative. Palliative care follow-up visit today for further management of decline, wait and discussions of goals of care including code status. At present she is sitting on the side of the bed, appears elderly, thin but comfortable, confused. No visitors present. I visited and observed Allison Macias. She did make eye contact with verbal cues. No meaningful discussion with cognitive impairment limitations. She did have difficulty understanding purpose of palliative care visit. She was cooperative with assessment. She had her suitcase packed next to her bed. She did ask when she was leaving. Emotional support provided. I did call Delton See, DSS Guardian. We talked about clinical update. We talked about visit with Allison Macias. We talked about Psychiatry visiting and continued on current medication regimen. We talked about her appetite and slate weight gain. Vince talked about appetite stimulant but wish to hold off to see if she was able to increase weight without adding another medication with concern for pill burden.  We talked about her slight weight gain. We talked about social isolation. We  talked about medical goals of care including aggressive versus conservative versus comfort care. We talked about code status says she currently is a full code. We talked about most form. Vince open to having a blank most form sent for further discussion with family for completion and discussion of code status. Offer to meet with family, [name] for follow-up palliative care visit if further questions noted. Vince agreement. We talked about role of palliative care and plan of care. Will follow up in 4 weeks if needed or sooner should she declined for weight check in revisit goals of care. Vince in agreement to contact earlier should decision be made concerning code status and most form. I have updated nursing staff in any changes to current goals or plan of care. Palliative Care was asked to help to continue to address goals of care.   CODE STATUS: Full code  PPS: 40% HOSPICE ELIGIBILITY/DIAGNOSIS: TBD  PAST MEDICAL HISTORY:  Past Medical History:  Diagnosis Date  . Diabetes mellitus without complication ([place])   . Hyperlipidemia   . Hypertension   . Ovarian cancer ([place])   . Uterine cancer ([place])     SOCIAL HX:  Social History   Tobacco Use  . Smoking status: Never Smoker  . Smokeless tobacco: Never Used  Substance Use Topics  . Alcohol use: No    ALLERGIES: No Known Allergies   PERTINENT MEDICATIONS:  Outpatient Encounter Medications as of 05/19/2019  Medication Sig  . acetaminophen (TYLENOL) 325 MG tablet Take 650 mg by mouth every 6 (six) hours as needed.  Marland Kitchen allopurinol (ZYLOPRIM) 100 MG tablet Take 100 mg by mouth daily.  Marland Kitchen aspirin 81 MG chewable tablet Chew by mouth daily.  . Brimonidine Tartrate 0.025 % SOLN Apply 1 drop to eye 3 (three) times daily.  . Calcium Carbonate Antacid (CALCIUM CARBONATE PO) Take 1,000 mg by mouth 3 (three) times daily.  . cloNIDine (CATAPRES - DOSED IN MG/24 HR) 0.1 mg/24hr patch Place 0.1 mg onto the skin once a week.  . docusate sodium (COLACE) 100 MG  capsule Take 100 mg by mouth 2 (two) times daily.  . insulin glargine (LANTUS) 100 UNIT/ML injection Inject 10 Units into the skin at bedtime.  . isosorbide mononitrate (IMDUR) 60 MG 24 hr tablet Take 60 mg by mouth daily.  Marland Kitchen latanoprost (XALATAN) 0.005 % ophthalmic solution Place 1 drop into both eyes at bedtime.  Marland Kitchen lisinopril (ZESTRIL) 40 MG tablet Take 40 mg by mouth daily.  . Melatonin 5 MG CAPS Take 5 mg by mouth at bedtime.  . metoprolol succinate (TOPROL-XL) 25 MG 24 hr tablet Take 25 mg by mouth 2 (two) times a day.  Marland Kitchen omeprazole (PRILOSEC) 20 MG capsule Take 20 mg by mouth daily.  . polyethylene glycol (MIRALAX / GLYCOLAX) 17 g packet Take 17 g by mouth daily.  . sertraline (ZOLOFT) 25 MG tablet Take 25 mg by mouth daily.  . sitaGLIPtin (JANUVIA) 25 MG tablet Take 25 mg by mouth at bedtime.   No facility-administered encounter medications on file as of 05/19/2019.     PHYSICAL EXAM:   General: NAD, frail appearing, thin, elderly female, confused Cardiovascular: regular rate and rhythm Pulmonary: clear ant fields Abdomen: soft, nontender, + bowel sounds Extremities: no edema, no joint deformities/muscle wasting Neurological: Weakness but otherwise nonfocal   Ihor Gully, NP

## 2019-05-20 ENCOUNTER — Other Ambulatory Visit: Payer: Self-pay

## 2019-08-01 DEATH — deceased
